# Patient Record
Sex: Female | Born: 1965 | Race: White | Hispanic: No | Marital: Married | Smoking: Never smoker
Health system: Southern US, Community
[De-identification: ages and names within clinical notes are randomized; demographics above are authoritative.]

## PROBLEM LIST (undated history)

## (undated) DIAGNOSIS — E049 Nontoxic goiter, unspecified: Secondary | ICD-10-CM

## (undated) DIAGNOSIS — D259 Leiomyoma of uterus, unspecified: Secondary | ICD-10-CM

## (undated) HISTORY — DX: Leiomyoma of uterus, unspecified: D25.9

## (undated) HISTORY — DX: Nontoxic goiter, unspecified: E04.9

---

## 2012-02-13 HISTORY — PX: TOTAL THYROIDECTOMY: SHX2547

## 2012-02-13 HISTORY — PX: ABDOMINAL HYSTERECTOMY: SHX81

## 2014-10-26 DIAGNOSIS — E049 Nontoxic goiter, unspecified: Secondary | ICD-10-CM | POA: Insufficient documentation

## 2016-07-10 ENCOUNTER — Emergency Department
Admission: EM | Admit: 2016-07-10 | Discharge: 2016-07-10 | Disposition: A | Discharge status: Home / Self Care | Payer: BC Managed Care – PPO | Attending: Emergency Medicine | Admitting: Emergency Medicine

## 2016-07-10 DIAGNOSIS — S199XXA Unspecified injury of neck, initial encounter: Secondary | ICD-10-CM | POA: Diagnosis present

## 2016-07-10 DIAGNOSIS — Y939 Activity, unspecified: Secondary | ICD-10-CM | POA: Diagnosis not present

## 2016-07-10 DIAGNOSIS — S161XXA Strain of muscle, fascia and tendon at neck level, initial encounter: Secondary | ICD-10-CM | POA: Diagnosis not present

## 2016-07-10 DIAGNOSIS — Y999 Unspecified external cause status: Secondary | ICD-10-CM | POA: Diagnosis not present

## 2016-07-10 DIAGNOSIS — Y92410 Unspecified street and highway as the place of occurrence of the external cause: Secondary | ICD-10-CM | POA: Diagnosis not present

## 2016-07-10 MED ORDER — CYCLOBENZAPRINE HCL 5 MG PO TABS: 5.0000 mg | ORAL_TABLET | Freq: Three times a day (TID) | ORAL | 0 refills | Status: DC | PRN

## 2016-07-10 MED ORDER — IBUPROFEN 800 MG PO TABS
800.0000 mg | ORAL_TABLET | Freq: Once | ORAL | Status: AC
  Administered 2016-07-10: 800 mg via ORAL
  Filled 2016-07-10: qty 1

## 2016-07-10 MED ORDER — IBUPROFEN 800 MG PO TABS: 800.0000 mg | ORAL_TABLET | Freq: Three times a day (TID) | ORAL | 0 refills | Status: DC | PRN

## 2016-07-10 MED ORDER — CYCLOBENZAPRINE HCL 10 MG PO TABS
10.0000 mg | ORAL_TABLET | Freq: Once | ORAL | Status: AC
  Administered 2016-07-10: 10 mg via ORAL
  Filled 2016-07-10: qty 1

## 2016-07-10 NOTE — Discharge Instructions (Signed)
Please take medications as prescribed and follow-up with primary care provider or Dr. Roland Rack office at [facility]. Return to the ER for any worsening symptoms or urgent changes in her health.
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
## 2016-07-12 ENCOUNTER — Other Ambulatory Visit: Payer: Self-pay | Admitting: Student

## 2016-07-12 DIAGNOSIS — Z1231 Encounter for screening mammogram for malignant neoplasm of breast: Secondary | ICD-10-CM

## 2016-08-01 ENCOUNTER — Ambulatory Visit
Admission: RE | Admit: 2016-08-01 | Discharge: 2016-08-01 | Disposition: A | Discharge status: Home / Self Care | Payer: BC Managed Care – PPO | Source: Ambulatory Visit | Attending: Student | Admitting: Student

## 2016-08-01 ENCOUNTER — Other Ambulatory Visit: Payer: Self-pay | Admitting: Student

## 2016-08-01 DIAGNOSIS — Z1231 Encounter for screening mammogram for malignant neoplasm of breast: Secondary | ICD-10-CM | POA: Insufficient documentation

## 2016-08-01 DIAGNOSIS — R928 Other abnormal and inconclusive findings on diagnostic imaging of breast: Secondary | ICD-10-CM | POA: Diagnosis not present

## 2016-08-03 ENCOUNTER — Other Ambulatory Visit: Payer: Self-pay | Admitting: *Deleted

## 2016-08-03 ENCOUNTER — Inpatient Hospital Stay
Admission: RE | Admit: 2016-08-03 | Discharge: 2016-08-03 | Disposition: A | Discharge status: Home / Self Care | Payer: Self-pay | Source: Ambulatory Visit | Attending: *Deleted | Admitting: *Deleted

## 2016-08-03 DIAGNOSIS — Z9289 Personal history of other medical treatment: Secondary | ICD-10-CM

## 2016-08-06 ENCOUNTER — Other Ambulatory Visit: Payer: Self-pay | Admitting: Student

## 2016-08-06 DIAGNOSIS — R928 Other abnormal and inconclusive findings on diagnostic imaging of breast: Secondary | ICD-10-CM

## 2016-08-06 DIAGNOSIS — N6320 Unspecified lump in the left breast, unspecified quadrant: Secondary | ICD-10-CM

## 2016-08-08 ENCOUNTER — Ambulatory Visit
Admission: RE | Admit: 2016-08-08 | Discharge: 2016-08-08 | Disposition: A | Discharge status: Home / Self Care | Payer: BC Managed Care – PPO | Source: Ambulatory Visit | Attending: Student | Admitting: Student

## 2016-08-08 DIAGNOSIS — N6002 Solitary cyst of left breast: Secondary | ICD-10-CM | POA: Insufficient documentation

## 2016-08-08 DIAGNOSIS — R928 Other abnormal and inconclusive findings on diagnostic imaging of breast: Secondary | ICD-10-CM

## 2016-08-08 DIAGNOSIS — N6320 Unspecified lump in the left breast, unspecified quadrant: Secondary | ICD-10-CM

## 2016-08-20 ENCOUNTER — Ambulatory Visit: Payer: BC Managed Care – PPO

## 2016-08-20 ENCOUNTER — Other Ambulatory Visit: Payer: BC Managed Care – PPO

## 2016-08-21 ENCOUNTER — Encounter: Payer: BC Managed Care – PPO | Attending: Family Medicine | Admitting: Dietician

## 2016-08-21 ENCOUNTER — Encounter: Payer: Self-pay | Admitting: Dietician

## 2016-08-21 VITALS — Ht 66.0 in | Wt 151.7 lb

## 2016-08-21 DIAGNOSIS — R7303 Prediabetes: Secondary | ICD-10-CM | POA: Diagnosis not present

## 2016-08-21 DIAGNOSIS — E7800 Pure hypercholesterolemia, unspecified: Secondary | ICD-10-CM

## 2016-08-21 NOTE — Patient Instructions (Signed)
   Use whole grain breads, including tortillas for wraps, pita breads, ry crisp breads in place of crackers, or triscuit crackers.   Include high fiber foods on a regular basis to help with blood sugar and cholesterol control.  Try peanut butter with less sugar and sodium such as Simply Jif.   Having a protein source with meals and snacks also helps prevent low blood sugar symptoms.

## 2016-08-21 NOTE — Progress Notes (Signed)
Medical Nutrition Therapy: Visit start time: 1100  end time: 1230  Assessment:  Diagnosis: pre-diabetes Past medical history: hyperlipidemia Psychosocial issues/ stress concerns: none Preferred learning method:  . Auditory . Visual  Current weight: 151.7lbs  Height: 5'6" Medications, supplements: none taken at this time  Progress and evaluation: Patient reports working on positive diet changes, such as cooking more at home, drinking less alcohol --now occasional light beer or small glass wine (was having a drink nightly), increasing vegetables and fruits. Avoids milk and ice cream due GI symptoms. She states she has lost 5-6lbs since making changes several weeks ago. The family has been making lower fat food choices for several years, since husband had heart surgery. She seeks help in making sure she is working on best ways to control blood sugar and cholesterol.   Physical activity: beginning some exercise, yoga and cardio. Hurt knee in December and had stopped exercise for the past several months.  Dietary Intake:  Usual eating pattern includes 3 meals and 2-3 snacks per day. Dining out frequency: 2 meals per week.  Breakfast: oatmeal or 2 eggs and whole grain toast. 1-2c black coffee Snack: 9-10am peanut butter crackers (homemade) Lunch: leftovers, 7/9 small portion steak and veg;. Quiche; salad and chicken; sandwich with peanut butter and jelly or banana or mayo and baked chips or veggie straws Snack: fruit, or veggies and dip, veggie straws Supper: chicken, fish 1x a week; veg.  Snack: sometimes chips; less recently due to less alcohol; sometimes fruit Beverages: water, flavored waters, occasional diet soda, unsweet tea.   Nutrition Care Education: Topics covered: diabetes prevention, hyperlipidemia Basic nutrition: basic food groups, appropriate nutrient balance, appropriate meal and snack schedule, general nutrition guidelines    Weight control: determined energy needs for weight  loss at 1600kcal daily with increased exercise; provided guidance for carbohydrate and protein needs (180g or 12 servings CHO, 70g or 10oz protein choices daily).  Diabetes prevention:  goals for BGs, appropriate meal and snack schedule, appropriate carb intake and balance, role of protein and fiber, preventing low BG symptoms and treating symptoms, healthy carbohydrate choices; balanced meal options. Hyperlipidemia: healthy and unhealthy fats, role of fiber, food sources of phytochemicals, plant sterols.  Other lifestyle changes:  benefits of regular exercise, tracking food intake.  Nutritional Diagnosis:  [place]-2.2 Altered nutrition-related laboratory As related to elevated HbA1C, hyperlipidemia.  As evidenced by lab report.  Intervention: Instruction as noted above.   Set goals with input from patient.    Commended patient for changes already made.    She requests BG check at follow-up visit.   Education Materials given:  . General diet guidelines for Diabetes . Food lists/ Planning A Balanced Meal . Sample meal pattern/ menus: Quick and Healthy Meals, Lunch on the Go . Goals/ instructions  Learner/ who was taught:  . Patient   Level of understanding: Marland Kitchen Verbalizes/ demonstrates competency  Demonstrated degree of understanding via:   Teach back Learning barriers: . None  Willingness to learn/ readiness for change: . Eager, change in progress  Monitoring and Evaluation:  Dietary intake, exercise, BG control, and body weight      follow up: 10/18/16

## 2016-10-18 ENCOUNTER — Ambulatory Visit: Payer: BC Managed Care – PPO | Admitting: Dietician

## 2017-08-30 ENCOUNTER — Ambulatory Visit (INDEPENDENT_AMBULATORY_CARE_PROVIDER_SITE_OTHER): Payer: BC Managed Care – PPO | Admitting: Obstetrics and Gynecology

## 2017-08-30 ENCOUNTER — Encounter: Payer: Self-pay | Admitting: Obstetrics and Gynecology

## 2017-08-30 VITALS — BP 124/76 | HR 77 | Ht 66.0 in | Wt 154.0 lb

## 2017-08-30 DIAGNOSIS — Z0000 Encounter for general adult medical examination without abnormal findings: Secondary | ICD-10-CM

## 2017-08-30 DIAGNOSIS — Z1211 Encounter for screening for malignant neoplasm of colon: Secondary | ICD-10-CM | POA: Diagnosis not present

## 2017-08-30 DIAGNOSIS — Z01411 Encounter for gynecological examination (general) (routine) with abnormal findings: Secondary | ICD-10-CM | POA: Diagnosis not present

## 2017-08-30 DIAGNOSIS — Z9889 Other specified postprocedural states: Secondary | ICD-10-CM | POA: Diagnosis not present

## 2017-08-30 DIAGNOSIS — E890 Postprocedural hypothyroidism: Secondary | ICD-10-CM

## 2017-08-30 DIAGNOSIS — Z1231 Encounter for screening mammogram for malignant neoplasm of breast: Secondary | ICD-10-CM | POA: Diagnosis not present

## 2017-08-30 DIAGNOSIS — Z9009 Acquired absence of other part of head and neck: Secondary | ICD-10-CM

## 2017-08-30 DIAGNOSIS — R7303 Prediabetes: Secondary | ICD-10-CM | POA: Diagnosis not present

## 2017-08-30 DIAGNOSIS — Z1239 Encounter for other screening for malignant neoplasm of breast: Secondary | ICD-10-CM

## 2017-08-30 NOTE — Progress Notes (Signed)
Gynecology Annual Exam  PCP: Derinda Late, MD  Chief Complaint:     Chief Complaint  Patient presents with  . Gynecologic Exam   History of Present Illness:Patient is a 52 y.o. [id number] presents for annual exam. The patient has no complaints today.  LMP: No LMP recorded. Patient has had a hysterectomy.  Menarche:not applicable  The patient is sexually active. She denies dyspareunia. The patient does perform self breast exams. There is no notable family history of breast or ovarian cancer in her family.  The patient wears seatbelts: yes. The patient has regular exercise: yes. She runs on the treadmill and does lunges et.  The patient denies current symptoms of depression.  Review of Systems: ROS Review of Systems - General ROS: negative Psychological ROS: negative ENT ROS: negative Hematological and Lymphatic ROS: negative Breast ROS: negative for breast lumps Respiratory ROS: no cough, shortness of breath, or wheezing Cardiovascular ROS: no chest pain or dyspnea on exertion Gastrointestinal ROS: no abdominal pain, change in bowel habits, or black or bloody stools Musculoskeletal ROS: negative  Past Medical History:      Past Medical History:  Diagnosis Date  . Goiter   . Uterine fibroid    Past Surgical History:       Past Surgical History:  Procedure Laterality Date  . ABDOMINAL HYSTERECTOMY  2014   Partial   . TOTAL THYROIDECTOMY Right 2014   Gynecologic History:  No LMP recorded. Patient has had a hysterectomy.  Last Pap: Results were: Normal per patient before her hysterectomy  Last mammogram: 2018 Results were: BI-RAD I  Obstetric History: [id number]  Family History:       Family History  Problem Relation Age of Onset  . Dementia Mother 63  . Heart failure Mother   . Parkinson's disease Father    Social History:  Social History        Socioeconomic History  . Marital status: Married    Spouse name: Not on file  . Number of children: Not on file  .  Years of education: Not on file  . Highest education level: Not on file  Occupational History  . Not on file  Social Needs  . Financial resource strain: Not on file  . Food insecurity:    Worry: Not on file    Inability: Not on file  . Transportation needs:    Medical: Not on file    Non-medical: Not on file  Tobacco Use  . Smoking status: Never Smoker  . Smokeless tobacco: Never Used  Substance and Sexual Activity  . Alcohol use: Yes    Alcohol/week: 1.8 oz    Types: 3 Standard drinks or equivalent per week  . Drug use: Never  . Sexual activity: Yes    Birth control/protection: Surgical    Comment: Hysterectomy   Lifestyle  . Physical activity:    Days per week: Not on file    Minutes per session: Not on file  . Stress: Not on file  Relationships  . Social connections:    Talks on phone: Not on file    Gets together: Not on file    Attends religious service: Not on file    Active member of club or organization: Not on file    Attends meetings of clubs or organizations: Not on file    Relationship status: Not on file  . Intimate partner violence:    Fear of current or ex partner: Not on file    Emotionally abused: Not  on file    Physically abused: Not on file    Forced sexual activity: Not on file  Other Topics Concern  . Not on file  Social History Narrative  . Not on file   Allergies:      Allergies  Allergen Reactions  . Penicillins Hives   Medications:  Prior to Admission medications   Not on File  Physical Exam  Vitals: Blood pressure 124/76, pulse 77, height 5\' 6"  (1.676 m), weight 154 lb (69.9 kg).  General: NAD  HEENT: normocephalic, anicteric  Thyroid: no enlargement, no palpable nodules  Pulmonary: No increased work of breathing, CTAB  Cardiovascular: RRR, distal pulses 2+  Breast: Breast symmetrical, no tenderness, no palpable nodules or masses, no skin or nipple retraction present, no nipple discharge. No axillary or supraclavicular  lymphadenopathy.  Abdomen: NABS, soft, non-tender, non-distended. Umbilicus without lesions. No hepatomegaly, splenomegaly or masses palpable. No evidence of hernia  Genitourinary:  External: Normal external female genitalia. Normal urethral meatus, normal Bartholin's and Skene's glands.  Vagina: Normal vaginal mucosa, no evidence of prolapse.  Cervix: Grossly normal in appearance, no bleeding  Uterus: Non-enlarged, mobile, normal contour. No CMT  Adnexa: ovaries non-enlarged, no adnexal masses  Rectal: deferred  Lymphatic: no evidence of inguinal lymphadenopathy  Extremities: no edema, erythema, or tenderness  Neurologic: Grossly intact  Psychiatric: mood appropriate, affect full  Female chaperone present for pelvic and breast portions of the physical exam  Assessment: 52 y.o. [id number] routine annual exam  Plan:     Problem List Items Addressed This Visit     None      1) Mammogram - recommend yearly screening mammogram. Mammogram Was ordered today  2) STI screening wasoffered and declined  3) ASCCP guidelines and rational discussed. Patient opts for discontinue secondary to prior hysterectomy screening interval  4) Osteoporosis  - per USPTF routine screening DEXA at age 40  - FRAX 67 year major fracture risk 94, 10 year hip fracture risk 0.7  Consider FDA-approved medical therapies in postmenopausal women and men aged 54 years and older, based on the following:  a) A hip or vertebral (clinical or morphometric) fracture  b) T-score ? -2.5 at the femoral neck or spine after appropriate evaluation to exclude secondary causes  C) Low bone mass (T-score between -1.0 and -2.5 at the femoral neck or spine) and a 10-year probability of a hip fracture ? 3% or a 10-year probability of a major osteoporosis-related fracture ? 20% based on the [place]-adapted WHO algorithm  5) Routine healthcare maintenance including cholesterol, diabetes screening discussed To return fasting at a later date  6)  Colonoscopy declined, she desires a cologuard. Screening recommended starting at age 62 for average risk individuals, age 75 for individuals deemed at increased risk (including African Americans) and recommended to continue until age 19. For patient age 16-85 individualized approach is recommended. Gold standard screening is via colonoscopy, Cologuard screening is an acceptable alternative for patient unwilling or unable to undergo colonoscopy. "Colorectal cancer screening for average?risk adults: 2018 guideline update from the American Cancer Society"[place]: A Cancer Journal for Clinicians: Jul 11, 2016  7) No follow-ups on file.    Adrian Prows MD [facility], [facility] 08/30/17 3:49 PM

## 2017-09-07 ENCOUNTER — Encounter: Payer: Self-pay | Admitting: Obstetrics and Gynecology

## 2017-09-19 ENCOUNTER — Other Ambulatory Visit: Payer: BC Managed Care – PPO

## 2017-09-19 DIAGNOSIS — Z9009 Acquired absence of other part of head and neck: Secondary | ICD-10-CM

## 2017-09-19 DIAGNOSIS — Z0000 Encounter for general adult medical examination without abnormal findings: Secondary | ICD-10-CM

## 2017-09-19 DIAGNOSIS — R7303 Prediabetes: Secondary | ICD-10-CM

## 2017-09-19 DIAGNOSIS — E890 Postprocedural hypothyroidism: Secondary | ICD-10-CM

## 2017-09-19 DIAGNOSIS — Z1239 Encounter for other screening for malignant neoplasm of breast: Secondary | ICD-10-CM

## 2017-09-20 LAB — BASIC METABOLIC PANEL
BUN / CREAT RATIO: 17 (ref 9–23)
BUN: 12 mg/dL (ref 6–24)
CALCIUM: 9.2 mg/dL (ref 8.7–10.2)
CHLORIDE: 103 mmol/L (ref 96–106)
CO2: 22 mmol/L (ref 20–29)
Creatinine, Ser: 0.71 mg/dL (ref 0.57–1.00)
GFR calc non Af Amer: 97 mL/min/{1.73_m2} (ref >59)
GFR, EST AFRICAN AMERICAN: 112 mL/min/{1.73_m2} (ref >59)
Glucose: 102 mg/dL — ABNORMAL HIGH (ref 65–99)
Potassium: 4.5 mmol/L (ref 3.5–5.2)
Sodium: 141 mmol/L (ref 134–144)

## 2017-09-20 LAB — CBC
HEMOGLOBIN: 13.9 g/dL (ref 11.1–15.9)
Hematocrit: 43.2 % (ref 34.0–46.6)
MCH: 30.2 pg (ref 26.6–33.0)
MCHC: 32.2 g/dL (ref 31.5–35.7)
MCV: 94 fL (ref 79–97)
Platelets: 312 10*3/uL (ref 150–450)
RBC: 4.6 x10E6/uL (ref 3.77–5.28)
RDW: 13.5 % (ref 12.3–15.4)
WBC: 6 10*3/uL (ref 3.4–10.8)

## 2017-09-20 LAB — HEMOGLOBIN A1C
ESTIMATED AVERAGE GLUCOSE: 117 mg/dL
HEMOGLOBIN A1C: 5.7 % — AB (ref 4.8–5.6)

## 2017-09-20 LAB — TSH+FREE T4
Free T4: 1.23 ng/dL (ref 0.82–1.77)
TSH: 2.35 u[IU]/mL (ref 0.450–4.500)

## 2017-10-09 ENCOUNTER — Ambulatory Visit
Admission: RE | Admit: 2017-10-09 | Discharge: 2017-10-09 | Disposition: A | Discharge status: Home / Self Care | Payer: BC Managed Care – PPO | Source: Ambulatory Visit | Attending: Obstetrics and Gynecology | Admitting: Obstetrics and Gynecology

## 2017-10-09 DIAGNOSIS — Z1231 Encounter for screening mammogram for malignant neoplasm of breast: Secondary | ICD-10-CM | POA: Diagnosis present

## 2018-05-02 IMAGING — MG MM DIGITAL DIAGNOSTIC UNILAT*L* W/ TOMO W/ CAD
7 series · 8 of 15 positions shown · non-contrast
Comparison: Previous exam(s).

CLINICAL DATA: Screening recall for a possible mass in the left
breast.

EXAM:
2D DIGITAL DIAGNOSTIC LEFT MAMMOGRAM WITH CAD AND ADJUNCT TOMO
ULTRASOUND LEFT BREAST

[L MLO synth-2D]
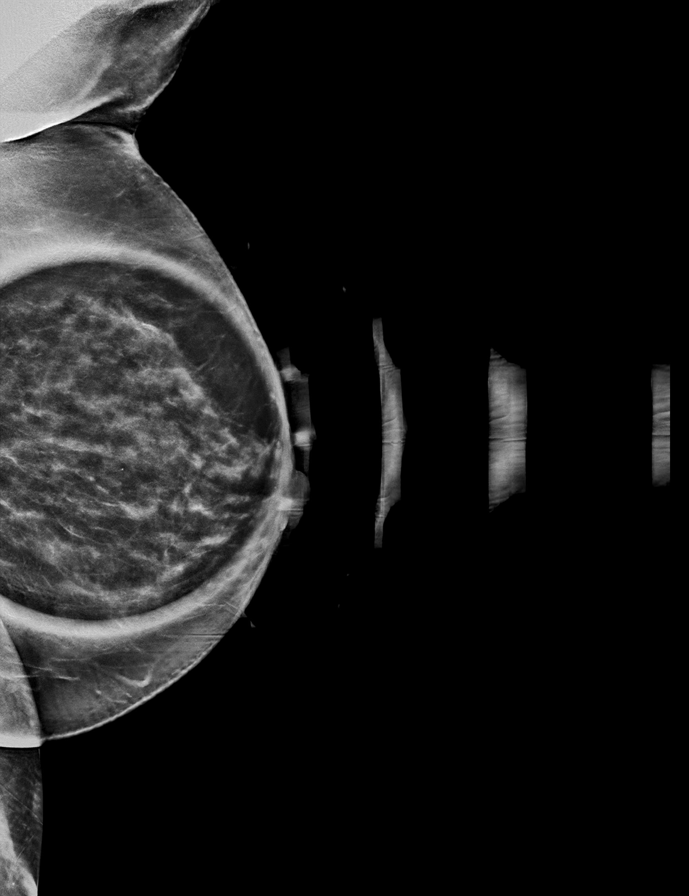

[L ML]
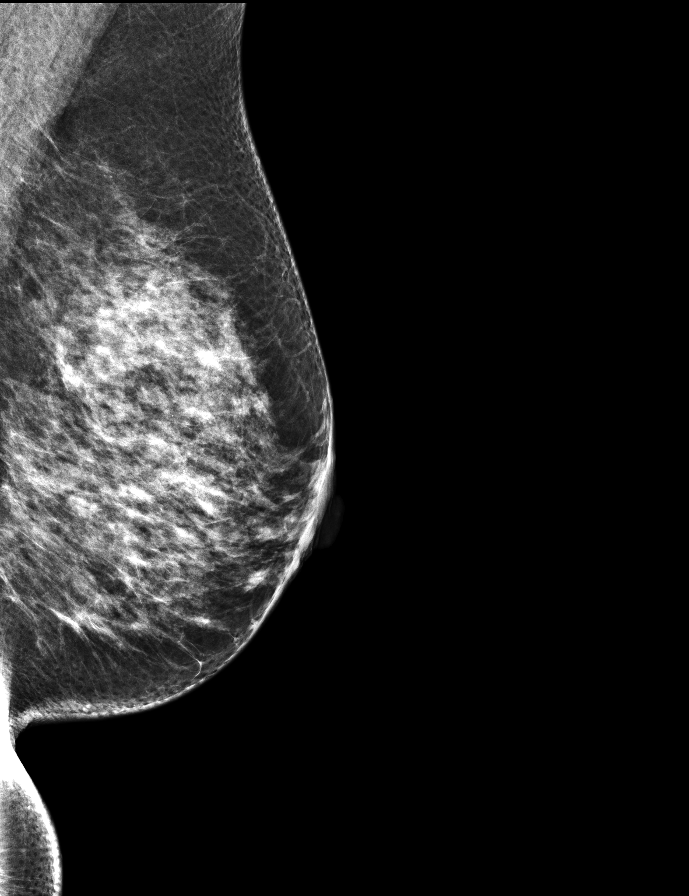

[L ML synth-2D]
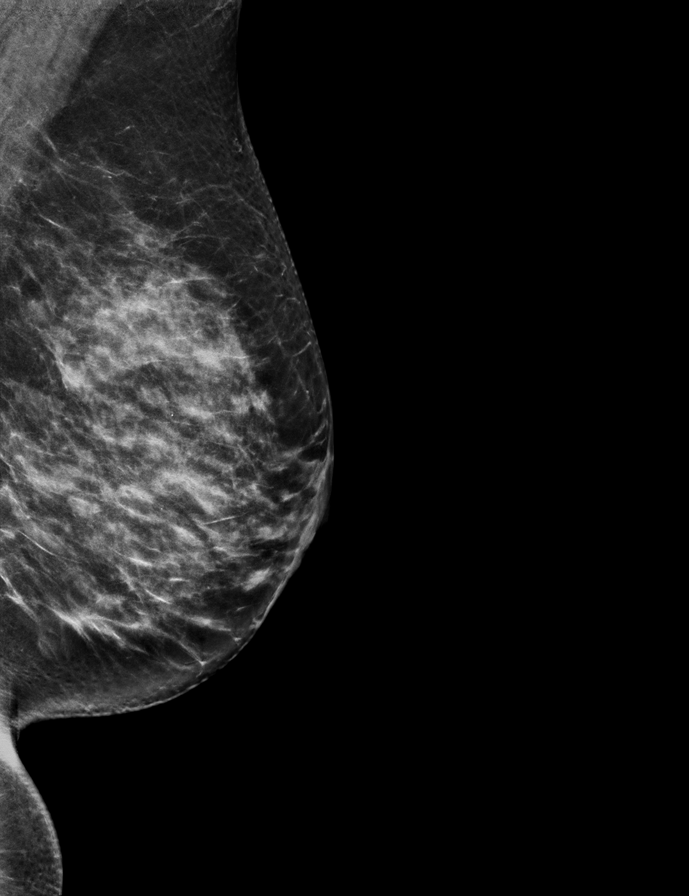

[L MLO (1 of 2)]
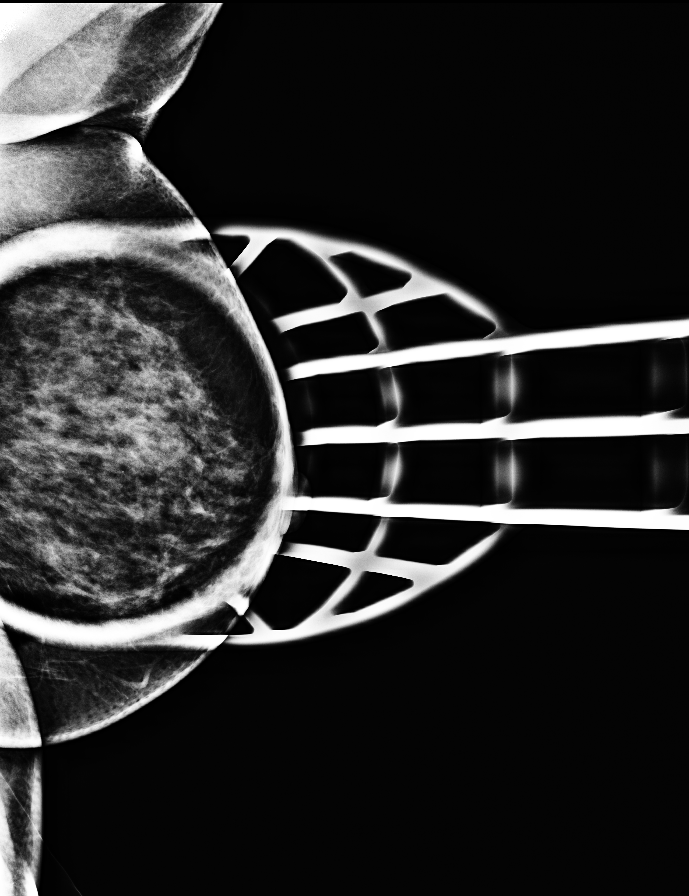

[L ML tomo · 2 of 67 frames shown]
[frame 22/67]
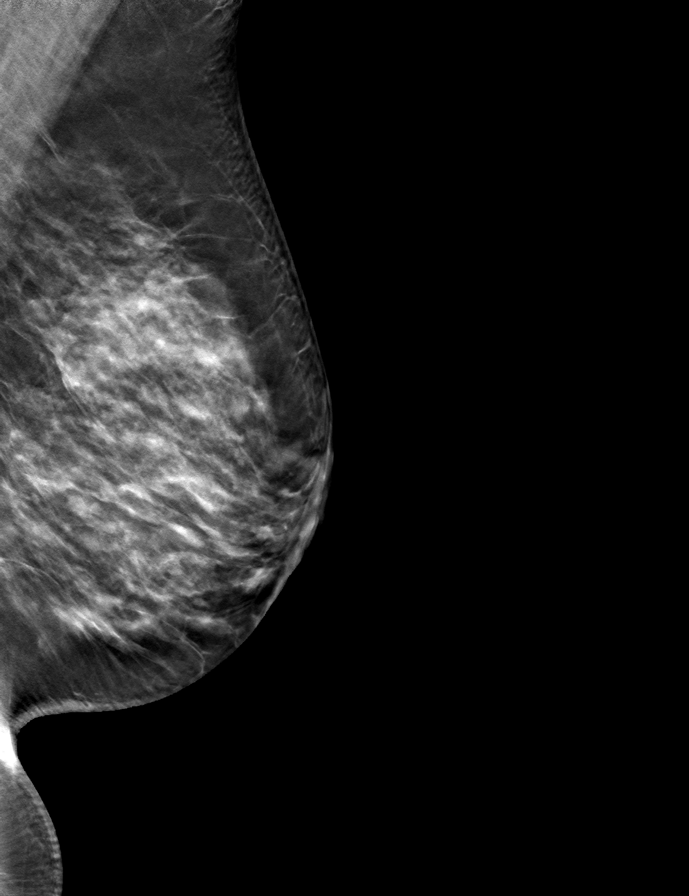
[frame 34/67]
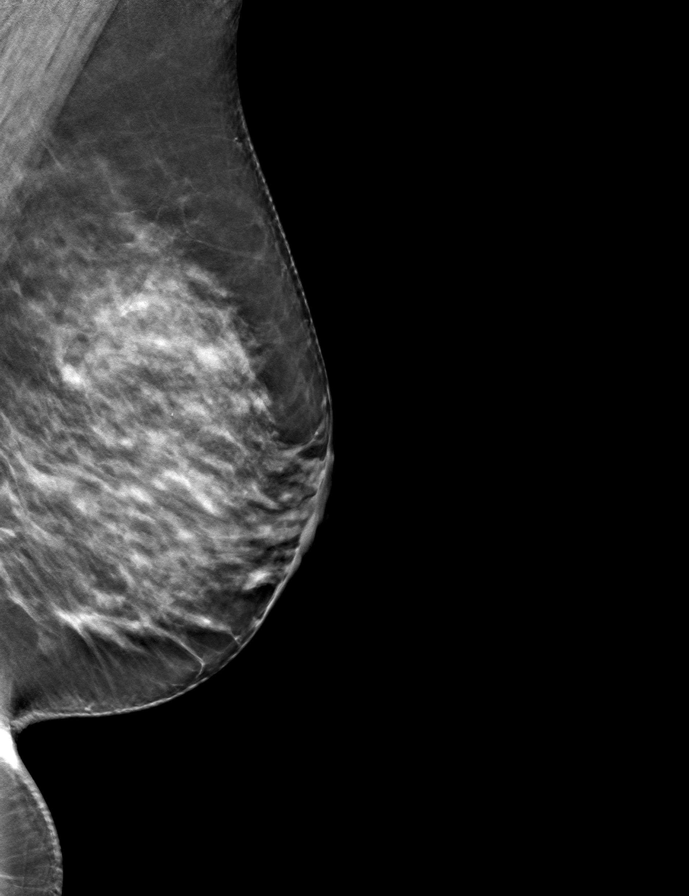

[L MLO tomo · tomo slice 35/69.0]
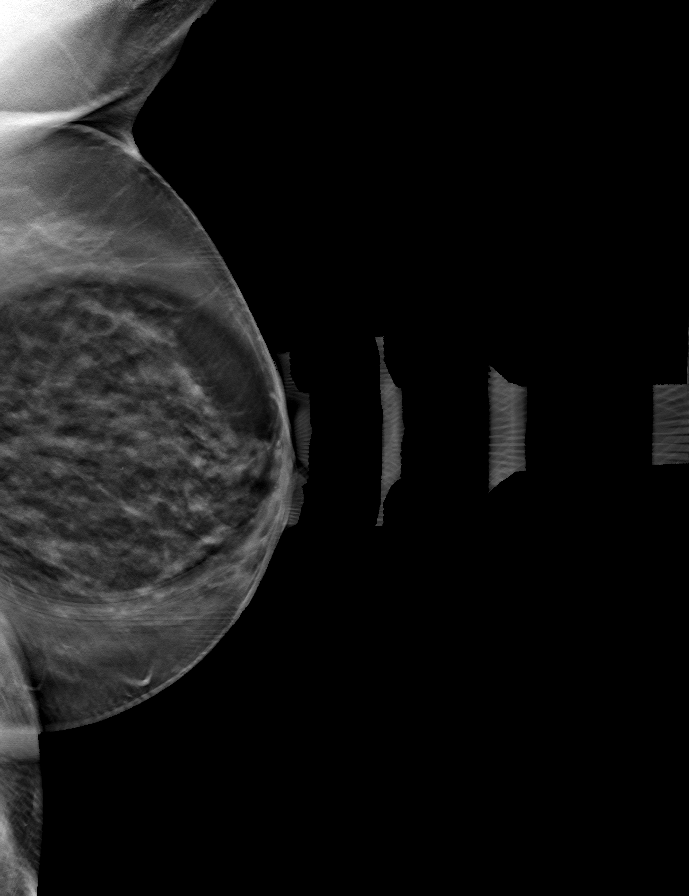

[L MLO (2 of 2)]
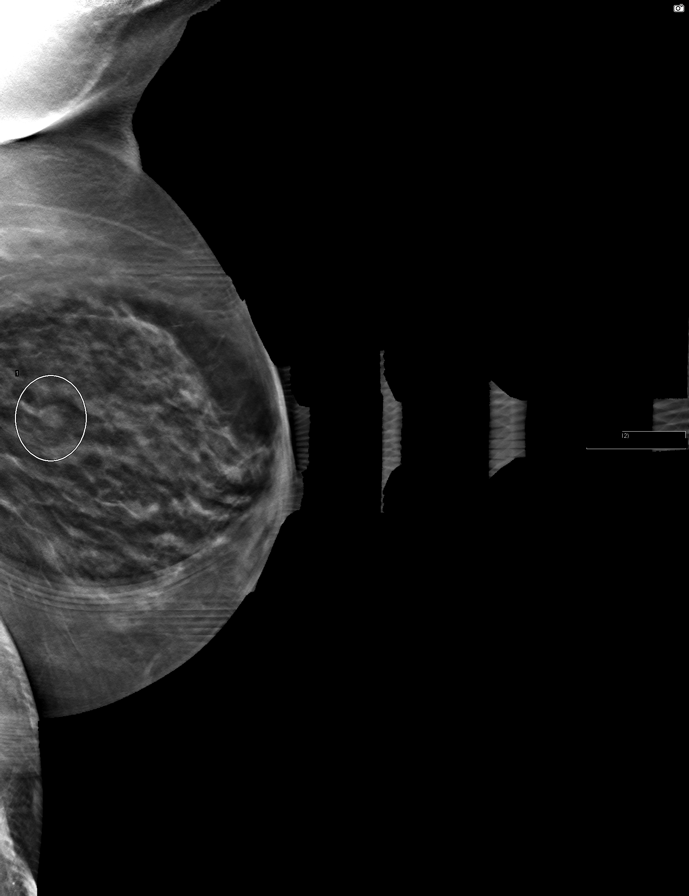

[8 of 15 positions shown; findings below may reference images not displayed]

ACR Breast Density Category c: The breast tissue is heterogeneously
dense, which may obscure small masses.
FINDINGS: On the diagnostic images, the possible mass is evident as a partly
circumscribed small oval mass in the lateral aspect of the breast
slightly below midline. There are no other discrete masses, no areas
of architectural distortion and no suspicious calcifications.

Mammographic images were processed with CAD.

On physical exam, no mass is palpated in the lateral left breast.

Targeted ultrasound is performed, showing a small cyst in the left
breast at the 3:30 o'clock position, 3 cm the nipple, measuring 6 x
5 x 5 mm, consistent in size, shape and location to the mammographic
finding. There are several other smaller cysts in the upper outer
quadrant. There are no solid masses or suspicious lesions.
IMPRESSION: 1. No evidence of malignancy.
2. Benign left breast cysts.

RECOMMENDATION:
Screening mammogram in one year.(Code:[id number]-L-3M1)

I have discussed the findings and recommendations with the patient.
Results were also provided in writing at the conclusion of the
visit. If applicable, a reminder letter will be sent to the patient
regarding the next appointment.

BI-RADS CATEGORY  2: Benign.

## 2019-02-11 ENCOUNTER — Encounter: Payer: Self-pay | Admitting: Obstetrics and Gynecology

## 2019-02-11 ENCOUNTER — Other Ambulatory Visit: Payer: Self-pay

## 2019-02-11 ENCOUNTER — Ambulatory Visit (INDEPENDENT_AMBULATORY_CARE_PROVIDER_SITE_OTHER): Payer: BC Managed Care – PPO | Admitting: Obstetrics and Gynecology

## 2019-02-11 ENCOUNTER — Other Ambulatory Visit (HOSPITAL_COMMUNITY)
Admission: RE | Admit: 2019-02-11 | Discharge: 2019-02-11 | Disposition: A | Discharge status: Home / Self Care | Payer: BC Managed Care – PPO | Source: Ambulatory Visit | Attending: Obstetrics and Gynecology | Admitting: Obstetrics and Gynecology

## 2019-02-11 VITALS — BP 136/82 | Ht 66.0 in | Wt 160.0 lb

## 2019-02-11 DIAGNOSIS — Z9009 Acquired absence of other part of head and neck: Secondary | ICD-10-CM

## 2019-02-11 DIAGNOSIS — Z1231 Encounter for screening mammogram for malignant neoplasm of breast: Secondary | ICD-10-CM

## 2019-02-11 DIAGNOSIS — E890 Postprocedural hypothyroidism: Secondary | ICD-10-CM

## 2019-02-11 DIAGNOSIS — L918 Other hypertrophic disorders of the skin: Secondary | ICD-10-CM

## 2019-02-11 DIAGNOSIS — Z13220 Encounter for screening for lipoid disorders: Secondary | ICD-10-CM

## 2019-02-11 DIAGNOSIS — Z1212 Encounter for screening for malignant neoplasm of rectum: Secondary | ICD-10-CM

## 2019-02-11 DIAGNOSIS — Z1211 Encounter for screening for malignant neoplasm of colon: Secondary | ICD-10-CM

## 2019-02-11 DIAGNOSIS — R7303 Prediabetes: Secondary | ICD-10-CM

## 2019-02-11 DIAGNOSIS — Z0000 Encounter for general adult medical examination without abnormal findings: Secondary | ICD-10-CM

## 2019-02-11 DIAGNOSIS — Z818 Family history of other mental and behavioral disorders: Secondary | ICD-10-CM

## 2019-02-11 NOTE — Patient Instructions (Addendum)
[employer] [place] for Calcium and Vitamin D  Age (yr) Calcium Recommended Dietary Allowance (mg/day) Vitamin D Recommended Dietary Allowance (international units/day)  9-18 1,300 600  19-50 1,000 600  51-70 1,200 600  71 and older 1,200 800  Data from [employer]. Dietary reference intakes: calcium, vitamin D. [place], [place]: [employer]; 2011.      Dementia Caregiver Guide Dementia is a term used to describe a number of symptoms that affect memory and thinking. The most common symptoms include:  Memory loss.  Trouble with language and communication.  Trouble concentrating.  Poor judgment.  Problems with reasoning.  Child-like behavior and language.  Extreme anxiety.  Angry outbursts.  Wandering from home or public places. Dementia usually gets worse slowly over time. In the early stages, people with dementia can stay independent and safe with some help. In later stages, they need help with daily tasks such as dressing, grooming, and using the bathroom. How to help the person with dementia cope Dementia can be frightening and confusing. Here are some tips to help the person with dementia cope with changes caused by the disease. General tips  Keep the person on track with his or her routine.  Try to identify areas where the person may need help.  Be supportive, patient, calm, and encouraging.  Gently remind the person that adjusting to changes takes time.  Help with the tasks that the person has asked for help with.  Keep the person involved in daily tasks and decisions as much as possible.  Encourage conversation, but try not to get frustrated or harried if the person struggles to find words or does not seem to appreciate your help. Communication tips  When the person is talking or seems frustrated, make eye contact and hold the person's hand.  Ask specific questions that need yes or no  answers.  Use simple words, short sentences, and a calm voice. Only give one direction at a time.  When offering choices, limit them to just 1 or 2.  Avoid correcting the person in a negative way.  If the person is struggling to find the right words, gently try to help him or her. How to recognize symptoms of stress Symptoms of stress in caregivers include:  Feeling frustrated or angry with the person with dementia.  Denying that the person has dementia or that his or her symptoms will not improve.  Feeling hopeless and unappreciated.  Difficulty sleeping.  Difficulty concentrating.  Feeling anxious, irritable, or depressed.  Developing stress-related health problems.  Feeling like you have too little time for your own life. Follow these instructions at home:   Make sure that you and the person you are caring for: ? Get regular sleep. ? Exercise regularly. ? Eat regular, nutritious meals. ? Drink enough fluid to keep your urine clear or pale yellow. ? Take over-the-counter and prescription medicines only as told by your health care providers. ? Attend all scheduled health care appointments.  Join a support group with others who are caregivers.  Ask about respite care resources so that you can have a regular break from the stress of caregiving.  Look for signs of stress in yourself and in the person you are caring for. If you notice signs of stress, take steps to manage it.  Consider any safety risks and take steps to avoid them.  Organize medications in a pill box for each day of the week.  Create a plan to handle any  legal or financial matters. Get legal or financial advice if needed.  Keep a calendar in a central location to remind the person of appointments or other activities. Tips for reducing the risk of injury  Keep floors clear of clutter. Remove rugs, magazine racks, and floor lamps.  Keep hallways well lit, especially at night.  Put a handrail and  nonslip mat in the bathtub or shower.  Put childproof locks on cabinets that contain dangerous items, such as medicines, alcohol, guns, toxic cleaning items, sharp tools or utensils, matches, and lighters.  Put the locks in places where the person cannot see or reach them easily. This will help ensure that the person does not wander out of the house and get lost.  Be prepared for emergencies. Keep a list of emergency phone numbers and addresses in a convenient area.  Remove car keys and lock garage doors so that the person does not try to get in the car and drive.  Have the person wear a bracelet that tracks locations and identifies the person as having memory problems. This should be worn at all times for safety. Where to find support: Many individuals and organizations offer support. These include:  Support groups for people with dementia and for caregivers.  Counselors or therapists.  Home health care services.  Adult day care centers. Where to find more information Alzheimer[employer] Association: [employer] Contact a health care provider if:  The person's health is rapidly getting worse.  You are no longer able to care for the person.  Caring for the person is affecting your physical and emotional health.  The person threatens himself or herself, you, or anyone else. Summary  Dementia is a term used to describe a number of symptoms that affect memory and thinking.  Dementia usually gets worse slowly over time.  Take steps to reduce the person's risk of injury, and to plan for future care.  Caregivers need support, relief from caregiving, and time for their own lives. This information is not intended to replace advice given to you by your health care provider. Make sure you discuss any questions you have with your health care provider. Document Released: 01/03/2016 Document Revised: 01/11/2017 Document Reviewed: 01/03/2016 Elsevier Patient Education  2020 [employer].

## 2019-02-11 NOTE — Progress Notes (Signed)
Gynecology Annual Exam  PCP: Derinda Late, MD  Chief Complaint:  Chief Complaint  Patient presents with  . Gynecologic Exam    Mom has dementia, is concerned about it because it runs in family     History of Present Illness:Patient is a 53 y.o. [id number] presents for annual exam. The patient has no complaints today.  Review of Systems: ROS  Past Medical History:  Past Medical History:  Diagnosis Date  . Goiter   . Uterine fibroid     Past Surgical History:  Past Surgical History:  Procedure Laterality Date  . ABDOMINAL HYSTERECTOMY  2014   Partial   . TOTAL THYROIDECTOMY Right 2014    Gynecologic History:  No LMP recorded. Patient has had a hysterectomy. Obstetric History: [id number]  Family History:  Family History  Problem Relation Age of Onset  . Dementia Mother 34  . Heart failure Mother   . Parkinson's disease Father   . Breast cancer Neg Hx     Social History:  Social History   Socioeconomic History  . Marital status: Married    Spouse name: Not on file  . Number of children: Not on file  . Years of education: Not on file  . Highest education level: Not on file  Occupational History  . Not on file  Tobacco Use  . Smoking status: Never Smoker  . Smokeless tobacco: Never Used  Substance and Sexual Activity  . Alcohol use: Yes    Alcohol/week: 3.0 standard drinks    Types: 3 Standard drinks or equivalent per week  . Drug use: Never  . Sexual activity: Yes    Birth control/protection: Surgical    Comment: Hysterectomy   Other Topics Concern  . Not on file  Social History Narrative  . Not on file   Social Determinants of Health   Financial Resource Strain:   . Difficulty of Paying Living Expenses: Not on file  Food Insecurity:   . Worried About Charity fundraiser in the Last Year: Not on file  . Ran Out of Food in the Last Year: Not on file  Transportation Needs:   . Lack of Transportation (Medical): Not on file  . Lack of  Transportation (Non-Medical): Not on file  Physical Activity:   . Days of Exercise per Week: Not on file  . Minutes of Exercise per Session: Not on file  Stress:   . Feeling of Stress : Not on file  Social Connections:   . Frequency of Communication with Friends and Family: Not on file  . Frequency of Social Gatherings with Friends and Family: Not on file  . Attends Religious Services: Not on file  . Active Member of Clubs or Organizations: Not on file  . Attends Archivist Meetings: Not on file  . Marital Status: Not on file  Intimate Partner Violence:   . Fear of Current or Ex-Partner: Not on file  . Emotionally Abused: Not on file  . Physically Abused: Not on file  . Sexually Abused: Not on file    Allergies:  Allergies  Allergen Reactions  . Penicillins Hives    Medications: Prior to Admission medications   Not on File    Physical Exam Vitals: Blood pressure 136/82, height 5\' 6"  (1.676 m), weight 160 lb (72.6 kg).  General: NAD HEENT: normocephalic, anicteric Thyroid: no enlargement, no palpable nodules Pulmonary: No increased work of breathing, CTAB Cardiovascular: RRR, distal pulses 2+ Breast: Breast symmetrical, no tenderness, no  palpable nodules or masses, no skin or nipple retraction present, no nipple discharge.  No axillary or supraclavicular lymphadenopathy. Abdomen: NABS, soft, non-tender, non-distended.  Umbilicus without lesions.  No hepatomegaly, splenomegaly or masses palpable. No evidence of hernia  Genitourinary:  External: Normal external female genitalia.  Normal urethral meatus, normal Bartholin's and Skene's glands.    Vagina: Normal vaginal mucosa, no evidence of prolapse.    Cervix: Grossly normal in appearance, no bleeding  Uterus: Non-enlarged, mobile, normal contour.  No CMT  Adnexa: ovaries non-enlarged, no adnexal masses  Rectal: deferred  Lymphatic: no evidence of inguinal lymphadenopathy Extremities: no edema, erythema, or  tenderness Neurologic: Grossly intact Psychiatric: mood appropriate, affect full  Female chaperone present for pelvic and breast  portions of the physical exam     Assessment: 53 y.o. [id number] routine annual exam  Plan: Problem List Items Addressed This Visit    None      1) Mammogram - recommend yearly screening mammogram.  Mammogram Was ordered today  2) STI screening was offered and declined  3) ASCCP guidelines and rational discussed.  Patient opts for every 3 years screening interval  4) Routine healthcare maintenance including cholesterol, diabetes screening discussed Ordered today  6) Cologuard ordered  7) Small skin tag removed  8) Family concerns regarding dementia- referral to neurology placed.  9) Follow up for annual in 1 year  Adrian Prows MD [place], [place] 02/11/2019 3:38 PM

## 2019-02-16 LAB — SURGICAL PATHOLOGY

## 2019-02-18 ENCOUNTER — Encounter: Payer: Self-pay | Admitting: Neurology

## 2019-03-06 ENCOUNTER — Other Ambulatory Visit: Payer: Self-pay

## 2019-03-06 ENCOUNTER — Encounter: Payer: Self-pay | Admitting: Obstetrics and Gynecology

## 2019-03-06 ENCOUNTER — Ambulatory Visit (INDEPENDENT_AMBULATORY_CARE_PROVIDER_SITE_OTHER): Payer: BC Managed Care – PPO | Admitting: Obstetrics and Gynecology

## 2019-03-06 VITALS — BP 120/80 | Ht 66.0 in | Wt 157.0 lb

## 2019-03-06 DIAGNOSIS — Z13220 Encounter for screening for lipoid disorders: Secondary | ICD-10-CM | POA: Diagnosis not present

## 2019-03-06 DIAGNOSIS — Z818 Family history of other mental and behavioral disorders: Secondary | ICD-10-CM | POA: Diagnosis not present

## 2019-03-06 DIAGNOSIS — Z1211 Encounter for screening for malignant neoplasm of colon: Secondary | ICD-10-CM | POA: Diagnosis not present

## 2019-03-06 NOTE — Progress Notes (Signed)
Patient ID: Katelyn Guzman, female   DOB: 04/17/1965, 54 y.o.   MRN: [id number]  Reason for Consult: Follow-up (Fasting lab work)   Referred by Derinda Late, MD  Subjective:     HPI:  Katelyn Guzman is a 54 y.o. female. She is feeling well today. She had an appointment for fasting labs.  Her mother has had the be placed in a care facility after she broke her hip and her Dementia has been challenging.  Has not yet received cologuard in mail.  Healing well from skin tag removal.    Past Medical History:  Diagnosis Date  . Goiter   . Uterine fibroid    Family History  Problem Relation Age of Onset  . Dementia Mother 55  . Heart failure Mother   . Parkinson's disease Father   . Breast cancer Neg Hx    Past Surgical History:  Procedure Laterality Date  . ABDOMINAL HYSTERECTOMY  2014   Partial   . TOTAL THYROIDECTOMY Right 2014    Short Social History:  Social History   Tobacco Use  . Smoking status: Never Smoker  . Smokeless tobacco: Never Used  Substance Use Topics  . Alcohol use: Yes    Alcohol/week: 3.0 standard drinks    Types: 3 Standard drinks or equivalent per week    Allergies  Allergen Reactions  . Penicillins Hives    No current outpatient medications on file.   No current facility-administered medications for this visit.    Review of Systems  Constitutional: Negative for chills, fatigue, fever and unexpected weight change.  HENT: Negative for trouble swallowing.  Eyes: Negative for loss of vision.  Respiratory: Negative for cough, shortness of breath and wheezing.  Cardiovascular: Negative for chest pain, leg swelling, palpitations and syncope.  GI: Negative for abdominal pain, blood in stool, diarrhea, nausea and vomiting.  GU: Negative for difficulty urinating, dysuria, frequency and hematuria.  Musculoskeletal: Negative for back pain, leg pain and joint pain.  Skin: Negative for rash.  Neurological: Negative for dizziness,  headaches, light-headedness, numbness and seizures.  Psychiatric: Negative for behavioral problem, confusion, depressed mood and sleep disturbance.        Objective:  Objective   Vitals:   03/06/19 0815  BP: 120/80  Weight: 157 lb (71.2 kg)  Height: 5\' 6"  (1.676 m)   Body mass index is 25.34 kg/m.  Physical Exam Vitals and nursing note reviewed.  Constitutional:      Appearance: She is well-developed.  HENT:     Head: Normocephalic and atraumatic.  Cardiovascular:     Rate and Rhythm: Normal rate and regular rhythm.  Pulmonary:     Effort: Pulmonary effort is normal.     Breath sounds: Normal breath sounds.  Abdominal:     General: Bowel sounds are normal.     Palpations: Abdomen is soft.  Musculoskeletal:        General: Normal range of motion.  Skin:    General: Skin is warm and dry.  Neurological:     Mental Status: She is alert and oriented to person, place, and time.  Psychiatric:        Behavior: Behavior normal.        Thought Content: Thought content normal.        Judgment: Judgment normal.        Assessment/Plan:     54 yo  1. Has follow up planned to speak with neurology regarding concerns with family history of dementia.  2. Cologuard re-faxed: encouraged patient to message or call if she does not receive it soon.  3. Encouraged patient to call and schedule mammogram 4. Fasting labs today  More than 10 minutes were spent face to face with the patient in the room with more than 50% of the time spent providing counseling and discussing the plan of management.    Adrian Prows MD [facility], [facility] 03/06/2019 8:48 AM

## 2019-03-07 LAB — CBC
Hematocrit: 41.8 % (ref 34.0–46.6)
Hemoglobin: 14.4 g/dL (ref 11.1–15.9)
MCH: 30.1 pg (ref 26.6–33.0)
MCHC: 34.4 g/dL (ref 31.5–35.7)
MCV: 87 fL (ref 79–97)
Platelets: 342 10*3/uL (ref 150–450)
RBC: 4.78 x10E6/uL (ref 3.77–5.28)
RDW: 12 % (ref 11.7–15.4)
WBC: 5 10*3/uL (ref 3.4–10.8)

## 2019-03-07 LAB — LIPID PANEL
Chol/HDL Ratio: 4.5 ratio — ABNORMAL HIGH (ref 0.0–4.4)
Cholesterol, Total: 247 mg/dL — ABNORMAL HIGH (ref 100–199)
HDL: 55 mg/dL (ref >39)
LDL Chol Calc (NIH): 151 mg/dL — ABNORMAL HIGH (ref 0–99)
Triglycerides: 223 mg/dL — ABNORMAL HIGH (ref 0–149)
VLDL Cholesterol Cal: 41 mg/dL — ABNORMAL HIGH (ref 5–40)

## 2019-03-07 LAB — TSH+FREE T4
Free T4: 1.15 ng/dL (ref 0.82–1.77)
TSH: 1.73 u[IU]/mL (ref 0.450–4.500)

## 2019-04-29 ENCOUNTER — Ambulatory Visit: Payer: BC Managed Care – PPO | Admitting: Neurology

## 2019-05-21 LAB — COLOGUARD

## 2019-06-16 NOTE — Telephone Encounter (Signed)
This patient is looking for her cologuard result. Can you help find it?

## 2019-08-04 ENCOUNTER — Ambulatory Visit: Payer: BC Managed Care – PPO | Admitting: Neurology

## 2020-02-15 ENCOUNTER — Ambulatory Visit: Payer: BC Managed Care – PPO | Admitting: Obstetrics and Gynecology

## 2020-03-14 ENCOUNTER — Ambulatory Visit: Payer: BC Managed Care – PPO | Admitting: Obstetrics and Gynecology

## 2020-03-22 ENCOUNTER — Encounter: Payer: Self-pay | Admitting: Obstetrics and Gynecology

## 2020-03-22 ENCOUNTER — Ambulatory Visit (INDEPENDENT_AMBULATORY_CARE_PROVIDER_SITE_OTHER): Payer: BC Managed Care – PPO | Admitting: Obstetrics and Gynecology

## 2020-03-22 ENCOUNTER — Other Ambulatory Visit: Payer: Self-pay

## 2020-03-22 VITALS — BP 120/72 | Ht 66.0 in | Wt 153.0 lb

## 2020-03-22 DIAGNOSIS — Z01419 Encounter for gynecological examination (general) (routine) without abnormal findings: Secondary | ICD-10-CM | POA: Diagnosis not present

## 2020-03-22 DIAGNOSIS — Z1231 Encounter for screening mammogram for malignant neoplasm of breast: Secondary | ICD-10-CM

## 2020-03-22 DIAGNOSIS — Z124 Encounter for screening for malignant neoplasm of cervix: Secondary | ICD-10-CM | POA: Diagnosis not present

## 2020-03-22 DIAGNOSIS — Z130 Encounter for screening for diseases of the blood and blood-forming organs and certain disorders involving the immune mechanism: Secondary | ICD-10-CM

## 2020-03-22 DIAGNOSIS — Z131 Encounter for screening for diabetes mellitus: Secondary | ICD-10-CM

## 2020-03-22 DIAGNOSIS — N951 Menopausal and female climacteric states: Secondary | ICD-10-CM

## 2020-03-22 DIAGNOSIS — Z0000 Encounter for general adult medical examination without abnormal findings: Secondary | ICD-10-CM | POA: Diagnosis not present

## 2020-03-22 DIAGNOSIS — Z13220 Encounter for screening for lipoid disorders: Secondary | ICD-10-CM

## 2020-03-22 DIAGNOSIS — Z1239 Encounter for other screening for malignant neoplasm of breast: Secondary | ICD-10-CM

## 2020-03-22 DIAGNOSIS — Z1329 Encounter for screening for other suspected endocrine disorder: Secondary | ICD-10-CM

## 2020-03-22 MED ORDER — PAROXETINE HCL 10 MG PO TABS: 10.0000 mg | ORAL_TABLET | Freq: Every day | ORAL | 4 refills | Status: DC

## 2020-03-22 NOTE — Patient Instructions (Signed)
Institute of Medicine Recommended Dietary Allowances for Calcium and Vitamin D  Age (yr) Calcium Recommended Dietary Allowance (mg/day) Vitamin D Recommended Dietary Allowance (international units/day)  9-18 1,300 600  19-50 1,000 600  51-70 1,200 600  71 and older 1,200 800  Data from Institute of Medicine. Dietary reference intakes: calcium, vitamin D. [place], [place]: National Academies Press; 2011.    Exercising to Stay Healthy To become healthy and stay healthy, it is recommended that you do moderate-intensity and vigorous-intensity exercise. You can tell that you are exercising at a moderate intensity if your heart starts beating faster and you start breathing faster but can still hold a conversation. You can tell that you are exercising at a vigorous intensity if you are breathing much harder and faster and cannot hold a conversation while exercising. Exercising regularly is important. It has many health benefits, such as:  Improving overall fitness, flexibility, and endurance.  Increasing bone density.  Helping with weight control.  Decreasing body fat.  Increasing muscle strength.  Reducing stress and tension.  Improving overall health. How often should I exercise? Choose an activity that you enjoy, and set realistic goals. Your health care provider can help you make an activity plan that works for you. Exercise regularly as told by your health care provider. This may include:  Doing strength training two times a week, such as: ? Lifting weights. ? Using resistance bands. ? Push-ups. ? Sit-ups. ? Yoga.  Doing a certain intensity of exercise for a given amount of time. Choose from these options: ? A total of 150 minutes of moderate-intensity exercise every week. ? A total of 75 minutes of vigorous-intensity exercise every week. ? A mix of moderate-intensity and vigorous-intensity exercise every week. Children, pregnant women, people who have not exercised  regularly, people who are overweight, and older adults may need to talk with a health care provider about what activities are safe to do. If you have a medical condition, be sure to talk with your health care provider before you start a new exercise program. What are some exercise ideas? Moderate-intensity exercise ideas include:  Walking 1 mile (1.6 km) in about 15 minutes.  Biking.  Hiking.  Golfing.  Dancing.  Water aerobics. Vigorous-intensity exercise ideas include:  Walking 4.5 miles (7.2 km) or more in about 1 hour.  Jogging or running 5 miles (8 km) in about 1 hour.  Biking 10 miles (16.1 km) or more in about 1 hour.  Lap swimming.  Roller-skating or in-line skating.  Cross-country skiing.  Vigorous competitive sports, such as football, basketball, and soccer.  Jumping rope.  Aerobic dancing.   What are some everyday activities that can help me to get exercise?  Yard work, such as: ? Pushing a lawn mower. ? Raking and bagging leaves.  Washing your car.  Pushing a stroller.  Shoveling snow.  Gardening.  Washing windows or floors. How can I be more active in my day-to-day activities?  Use stairs instead of an elevator.  Take a walk during your lunch break.  If you drive, park your car farther away from your work or school.  If you take public transportation, get off one stop early and walk the rest of the way.  Stand up or walk around during all of your indoor phone calls.  Get up, stretch, and walk around every 30 minutes throughout the day.  Enjoy exercise with a friend. Support to continue exercising will help you keep a regular routine of activity. What guidelines   can I follow while exercising?  Before you start a new exercise program, talk with your health care provider.  Do not exercise so much that you hurt yourself, feel dizzy, or get very short of breath.  Wear comfortable clothes and wear shoes with good support.  Drink plenty of  water while you exercise to prevent dehydration or heat stroke.  Work out until your breathing and your heartbeat get faster. Where to find more information  [place] Department of Health and Human Services: www.hhs.gov  Centers for Disease Control and Prevention (CDC): www.cdc.gov Summary  Exercising regularly is important. It will improve your overall fitness, flexibility, and endurance.  Regular exercise also will improve your overall health. It can help you control your weight, reduce stress, and improve your bone density.  Do not exercise so much that you hurt yourself, feel dizzy, or get very short of breath.  Before you start a new exercise program, talk with your health care provider. This information is not intended to replace advice given to you by your health care provider. Make sure you discuss any questions you have with your health care provider. Document Revised: 01/11/2017 Document Reviewed: 12/20/2016 Elsevier Patient Education  2021 Elsevier Inc.   Budget-Friendly Healthy Eating There are many ways to save money at the grocery store and continue to eat healthy. You can be successful if you:  Plan meals according to your budget.  Make a grocery list and only purchase food according to your grocery list.  Prepare food yourself at home. What are tips for following this plan? Reading food labels  Compare food labels between brand name foods and the store brand. Often the nutritional value is the same, but the store brand is lower cost.  Look for products that do not have added sugar, fat, or salt (sodium). These often cost the same but are healthier for you. Products may be labeled as: ? Sugar-free. ? Nonfat. ? Low-fat. ? Sodium-free. ? Low-sodium.  Look for lean ground beef labeled as at least 92% lean and 8% fat. Shopping  Buy only the items on your grocery list and go only to the areas of the store that have the items on your list.  Use coupons only for  foods and brands you normally buy. Avoid buying items you wouldn't normally buy simply because they are on sale.  Check online and in newspapers for weekly deals.  Buy healthy items from the bulk bins when available, such as herbs, spices, flour, pasta, nuts, and dried fruit.  Buy fruits and vegetables that are in season. Prices are usually lower on in-season produce.  Look at the unit price on the price tag. Use it to compare different brands and sizes to find out which item is the best deal.  Choose healthy items that are often low-cost, such as carrots, potatoes, apples, bananas, and oranges. Dried or canned beans are a low-cost protein source.  Buy in bulk and freeze extra food. Items you can buy in bulk include meats, fish, poultry, frozen fruits, and frozen vegetables.  Avoid buying "ready-to-eat" foods, such as pre-cut fruits and vegetables and pre-made salads.  If possible, shop around to discover where you can find the best prices. Consider other retailers such as dollar stores, larger wholesale stores, local fruit and vegetable stands, and farmers markets.  Do not shop when you are hungry. If you shop while hungry, it may be hard to stick to your list and budget.  Resist impulse buying. Use your grocery   list as your official plan for the week.  Buy a variety of vegetables and fruits by purchasing fresh, frozen, and canned items.  Look at the top and bottom shelves for deals. Foods at eye level (eye level of an adult or child) are usually more expensive.  Be efficient with your time when shopping. The more time you spend at the store, the more money you are likely to spend.  To save money when choosing more expensive foods like meats and dairy: ? Choose cheaper cuts of meat, such as bone-in chicken thighs and drumsticks instead of skinless and boneless chicken. When you are ready to prepare the chicken, you can remove the skin yourself to make it healthier. ? Choose lean meats  like chicken or turkey instead of beef. ? Choose canned seafood, such as tuna, salmon, or sardines. ? Buy eggs as a low-cost source of protein. ? Buy dried beans and peas, such as lentils, split peas, or kidney beans instead of meats. Dried beans and peas are a good alternative source of protein. ? Buy the larger tubs of yogurt instead of individual-sized containers.  Choose water instead of sodas and other sweetened beverages.  Avoid buying chips, cookies, and other "junk food." These items are usually expensive and not healthy.   Cooking  Make extra food and freeze the extras in meal-sized containers or in individual portions for fast meals and snacks.  Pre-cook on days when you have extra time to prepare meals in advance. You can keep these meals in the fridge or freezer and reheat for a quick meal.  When you come home from the grocery store, wash, peel, and cut fruits and vegetables so they are ready to use and eat. This will help reduce food waste. Meal planning  Do not eat out or get fast food. Prepare food at home.  Make a grocery list and make sure to bring it with you to the store. If you have a smart phone, you could use your phone to create your shopping list.  Plan meals and snacks according to a grocery list and budget you create.  Use leftovers in your meal plan for the week.  Look for recipes where you can cook once and make enough food for two meals.  Prepare budget-friendly types of meals like stews, casseroles, and stir-fry dishes.  Try some meatless meals or try "no cook" meals like salads.  Make sure that half your plate is filled with fruits or vegetables. Choose from fresh, frozen, or canned fruits and vegetables. If eating canned, remember to rinse them before eating. This will remove any excess salt added for packaging. Summary  Eating healthy on a budget is possible if you plan your meals according to your budget, purchase according to your budget and  grocery list, and prepare food yourself.  Tips for buying more food on a limited budget include buying generic brands, using coupons only for foods you normally buy, and buying healthy items from the bulk bins when available.  Tips for buying cheaper food to replace expensive food include choosing cheaper, lean cuts of meat, and buying dried beans and peas. This information is not intended to replace advice given to you by your health care provider. Make sure you discuss any questions you have with your health care provider. Document Revised: 11/12/2019 Document Reviewed: 11/12/2019 Elsevier Patient Education  2021 Elsevier Inc.   Bone Health Bones protect organs, store calcium, anchor muscles, and support the whole body. Keeping your bones   strong is important, especially as you get older. You can take actions to help keep your bones strong and healthy. Why is keeping my bones healthy important? Keeping your bones healthy is important because your body constantly replaces bone cells. Cells get old, and new cells take their place. As we age, we lose bone cells because the body may not be able to make enough new cells to replace the old cells. The amount of bone cells and bone tissue you have is referred to as bone mass. The higher your bone mass, the stronger your bones. The aging process leads to an overall loss of bone mass in the body, which can increase the likelihood of:  Joint pain and stiffness.  Broken bones.  A condition in which the bones become weak and brittle (osteoporosis). A large decline in bone mass occurs in older adults. In women, it occurs about the time of menopause.   What actions can I take to keep my bones healthy? Good health habits are important for maintaining healthy bones. This includes eating nutritious foods and exercising regularly. To have healthy bones, you need to get enough of the right minerals and vitamins. Most nutrition experts recommend getting these  nutrients from the foods that you eat. In some cases, taking supplements may also be recommended. Doing certain types of exercise is also important for bone health. What are the nutritional recommendations for healthy bones? Eating a well-balanced diet with plenty of calcium and vitamin D will help to protect your bones. Nutritional recommendations vary from person to person. Ask your health care provider what is healthy for you. Here are some general guidelines. Get enough calcium Calcium is the most important (essential) mineral for bone health. Most people can get enough calcium from their diet, but supplements may be recommended for people who are at risk for osteoporosis. Good sources of calcium include:  Dairy products, such as low-fat or nonfat milk, cheese, and yogurt.  Dark green leafy vegetables, such as bok choy and broccoli.  Calcium-fortified foods, such as orange juice, cereal, bread, soy beverages, and tofu products.  Nuts, such as almonds. Follow these recommended amounts for daily calcium intake:  Children, age 1-3: 700 mg.  Children, age 4-8: 1,000 mg.  Children, age 9-13: 1,300 mg.  Teens, age 14-18: 1,300 mg.  Adults, age 19-50: 1,000 mg.  Adults, age 51-70: ? Men: 1,000 mg. ? Women: 1,200 mg.  Adults, age 71 or older: 1,200 mg.  Pregnant and breastfeeding females: ? Teens: 1,300 mg. ? Adults: 1,000 mg. Get enough vitamin D Vitamin D is the most essential vitamin for bone health. It helps the body absorb calcium. Sunlight stimulates the skin to make vitamin D, so be sure to get enough sunlight. If you live in a cold climate or you do not get outside often, your health care provider may recommend that you take vitamin D supplements. Good sources of vitamin D in your diet include:  Egg yolks.  Saltwater fish.  Milk and cereal fortified with vitamin D. Follow these recommended amounts for daily vitamin D intake:  Children and teens, age 1-18: 600  international units.  Adults, age 50 or younger: 400-800 international units.  Adults, age 51 or older: 800-1,000 international units. Get other important nutrients Other nutrients that are important for bone health include:  Phosphorus. This mineral is found in meat, poultry, dairy foods, nuts, and legumes. The recommended daily intake for adult men and adult women is 700 mg.  Magnesium. This mineral   is found in seeds, nuts, dark green vegetables, and legumes. The recommended daily intake for adult men is 400-420 mg. For adult women, it is 310-320 mg.  Vitamin K. This vitamin is found in green leafy vegetables. The recommended daily intake is 120 mg for adult men and 90 mg for adult women.   What type of physical activity is best for building and maintaining healthy bones? Weight-bearing and strength-building activities are important for building and maintaining healthy bones. Weight-bearing activities cause muscles and bones to work against gravity. Strength-building activities increase the strength of the muscles that support bones. Weight-bearing and muscle-building activities include:  Walking and hiking.  Jogging and running.  Dancing.  Gym exercises.  Lifting weights.  Tennis and racquetball.  Climbing stairs.  Aerobics. Adults should get at least 30 minutes of moderate physical activity on most days. Children should get at least 60 minutes of moderate physical activity on most days. Ask your health care provider what type of exercise is best for you.   How can I find out if my bone mass is low? Bone mass can be measured with an X-ray test called a bone mineral density (BMD) test. This test is recommended for all women who are age 65 or older. It may also be recommended for:  Men who are age 70 or older.  People who are at risk for osteoporosis because of: ? Having bones that break easily. ? Having a long-term disease that weakens bones, such as kidney disease or  rheumatoid arthritis. ? Having menopause earlier than normal. ? Taking medicine that weakens bones, such as steroids, thyroid hormones, or hormone treatment for breast cancer or prostate cancer. ? Smoking. ? Drinking three or more alcoholic drinks a day. If you find that you have a low bone mass, you may be able to prevent osteoporosis or further bone loss by changing your diet and lifestyle. Where can I find more information? For more information, check out the following websites:  National Osteoporosis Foundation: www.nof.org/patients  National Institutes of Health: www.bones.nih.gov  International Osteoporosis Foundation: www.iofbonehealth.org Summary  The aging process leads to an overall loss of bone mass in the body, which can increase the likelihood of broken bones and osteoporosis.  Eating a well-balanced diet with plenty of calcium and vitamin D will help to protect your bones.  Weight-bearing and strength-building activities are also important for building and maintaining strong bones.  Bone mass can be measured with an X-ray test called a bone mineral density (BMD) test. This information is not intended to replace advice given to you by your health care provider. Make sure you discuss any questions you have with your health care provider. Document Revised: 02/25/2017 Document Reviewed: 02/25/2017 Elsevier Patient Education  2021 Elsevier Inc.   

## 2020-03-22 NOTE — Progress Notes (Signed)
Gynecology Annual Exam  PCP: Derinda Late, MD  Chief Complaint:  Chief Complaint  Patient presents with  . Gynecologic Exam    History of Present Illness: Patient is a 55 y.o. [id number] presents for annual exam. The patient has no complaints today.   LMP: No LMP recorded. Patient has had a hysterectomy. She denies postmenopausal bleeding or spotting  Reports in the last 6 months she has been having problematic hot flashes occurring 4-5 times a day every day. She is dressing in layers and has many fans.  She is also having some difficulty sleeping and has been taking tylenol PM almost daily.   The patient is sexually active. She denies dyspareunia.   The patient does perform self breast exams.  There is notable family history of breast or ovarian cancer in her family.  The patient has regular exercise: walking 3 times a week  The patient denies current symptoms of depression.   PHQ-9: 2 GAD-7: 4   Review of Systems: Review of Systems  Constitutional: Negative for chills, fever, malaise/fatigue and weight loss.  HENT: Negative for congestion, hearing loss and sinus pain.   Eyes: Negative for blurred vision and double vision.  Respiratory: Negative for cough, sputum production, shortness of breath and wheezing.   Cardiovascular: Negative for chest pain, palpitations, orthopnea and leg swelling.  Gastrointestinal: Negative for abdominal pain, constipation, diarrhea, nausea and vomiting.  Genitourinary: Negative for dysuria, flank pain, frequency, hematuria and urgency.  Musculoskeletal: Negative for back pain, falls and joint pain.  Skin: Negative for itching and rash.  Neurological: Negative for dizziness and headaches.  Psychiatric/Behavioral: Negative for depression, substance abuse and suicidal ideas. The patient is not nervous/anxious.     Past Medical History:  Past Medical History:  Diagnosis Date  . Goiter   . Uterine fibroid     Past Surgical History:   Past Surgical History:  Procedure Laterality Date  . ABDOMINAL HYSTERECTOMY  2014   Partial   . TOTAL THYROIDECTOMY Right 2014    Gynecologic History:  No LMP recorded. Patient has had a hysterectomy. Last Pap: discontinued because of hysterectomy Last mammogram: 2019  Results were: BI-RAD I  Obstetric History: [id number]  Family History:  Family History  Problem Relation Age of Onset  . Dementia Mother 33  . Heart failure Mother   . Parkinson's disease Father   . Breast cancer Neg Hx     Social History:  Social History   Socioeconomic History  . Marital status: Married    Spouse name: Not on file  . Number of children: Not on file  . Years of education: Not on file  . Highest education level: Not on file  Occupational History  . Not on file  Tobacco Use  . Smoking status: Never Smoker  . Smokeless tobacco: Never Used  Vaping Use  . Vaping Use: Never used  Substance and Sexual Activity  . Alcohol use: Yes    Alcohol/week: 3.0 standard drinks    Types: 3 Standard drinks or equivalent per week  . Drug use: Never  . Sexual activity: Yes    Birth control/protection: Surgical    Comment: Hysterectomy   Other Topics Concern  . Not on file  Social History Narrative  . Not on file   Social Determinants of Health   Financial Resource Strain: Not on file  Food Insecurity: Not on file  Transportation Needs: Not on file  Physical Activity: Not on file  Stress: Not on file  Social Connections: Not on file  Intimate Partner Violence: Not on file    Allergies:  Allergies  Allergen Reactions  . Penicillins Hives    Medications: Prior to Admission medications   Not on File    Physical Exam Vitals: Blood pressure 120/72, height 5\' 6"  (1.676 m), weight 153 lb (69.4 kg).  Physical Exam Constitutional:      Appearance: She is well-developed.  Genitourinary:     Vagina and uterus normal.     There is no lesion on the right labia.     There is no lesion on  the left labia.    Genitourinary Comments: External: Normal appearing vulva. No lesions noted.  Bimanual examination: Uterus absent.  No adnexal masses. No adnexal tenderness. Pelvis not fixed.      Uterus is absent.  Breasts:     Right: No inverted nipple, mass, nipple discharge or skin change.     Left: No inverted nipple, mass, nipple discharge or skin change.    HENT:     Head: Normocephalic and atraumatic.  Eyes:     Extraocular Movements: EOM normal.  Neck:     Thyroid: No thyromegaly.  Cardiovascular:     Rate and Rhythm: Normal rate and regular rhythm.     Heart sounds: Normal heart sounds.  Pulmonary:     Effort: Pulmonary effort is normal.     Breath sounds: Normal breath sounds.  Abdominal:     General: Bowel sounds are normal. There is no distension.     Palpations: Abdomen is soft. There is no mass.  Musculoskeletal:     Cervical back: Neck supple.  Neurological:     Mental Status: She is alert and oriented to person, place, and time.  Skin:    General: Skin is warm and dry.  Psychiatric:        Mood and Affect: Mood and affect normal.        Behavior: Behavior normal.        Thought Content: Thought content normal.        Judgment: Judgment normal.  Vitals reviewed.      Female chaperone present for pelvic and breast  portions of the physical exam  Assessment: 55 y.o. [id number] routine annual exam  Plan: Problem List Items Addressed This Visit   None   Visit Diagnoses    Encounter for annual routine gynecological examination    -  Primary   Health maintenance examination       Breast cancer screening by mammogram       Relevant Orders   MM 3D SCREEN BREAST BILATERAL   Cervical cancer screening       Encounter for gynecological examination without abnormal finding       Screening cholesterol level       Relevant Orders   Lipid panel   Screening for diabetes mellitus       Relevant Orders   Basic Metabolic Panel (BMET)   Encounter for screening  breast examination       Screening for thyroid disorder       Relevant Orders   TSH   Screening for deficiency anemia       Relevant Orders   CBC With Differential      1) Mammogram - recommend yearly screening mammogram.  Mammogram Was ordered today  2) STI screening was offered and declined  3) ASCCP guidelines and rational discussed.  Patient opts for discontinuation secondary to prior hysterectomy.   4) Colonoscopy --  Cologuard 4/8/20201 normal- repeat in 3 years  5) Routine healthcare maintenance including cholesterol, diabetes screening discussed to return fasting at a later date. Reviewed ASCVD risk calculator score: 2.4% 10 year risk  6) Osteoporosis screening -   FRAX SCORE 1. Age: 53 2. Sex: female 3. Weight: 69 kg 4. Height: 167 cm 5. Previous Fracture: No 6. Parent Hip Fracture: No 7. Current Smoking: No 8. Glucocorticoids: No 9. Rheumatoid arthritis: No 10. Secondary osteoporosis: No 11. Alcohol ( 3 or more units a day):  No 12. Femoral Neck BMD (g/cm2): unknown  RESULT 10 year risk of Major Osteoporotic Fracture: 6.1% 10 year risk of Hip Fracture: 0.4%  7) Reviewed options for managing hot flashes- will trial paxil. Will send mychart message in 4 weeks to follow up if she is happy with medication.  Wishes to avoid HRT.  Adrian Prows MD, [facility], [facility] 03/22/2020 4:16 PM

## 2020-04-13 ENCOUNTER — Other Ambulatory Visit: Payer: Self-pay | Admitting: Obstetrics and Gynecology

## 2020-04-13 DIAGNOSIS — N951 Menopausal and female climacteric states: Secondary | ICD-10-CM

## 2020-07-14 NOTE — Telephone Encounter (Signed)
Called the patient- she did not answer, voicemail full.

## 2020-07-15 ENCOUNTER — Other Ambulatory Visit: Payer: Self-pay | Admitting: Obstetrics and Gynecology

## 2020-07-15 DIAGNOSIS — R197 Diarrhea, unspecified: Secondary | ICD-10-CM

## 2020-07-27 ENCOUNTER — Other Ambulatory Visit: Payer: Self-pay

## 2020-07-27 ENCOUNTER — Ambulatory Visit
Admission: RE | Admit: 2020-07-27 | Discharge: 2020-07-27 | Disposition: A | Discharge status: Home / Self Care | Payer: BC Managed Care – PPO | Source: Ambulatory Visit | Attending: Obstetrics and Gynecology | Admitting: Obstetrics and Gynecology

## 2020-07-27 DIAGNOSIS — Z1231 Encounter for screening mammogram for malignant neoplasm of breast: Secondary | ICD-10-CM

## 2020-08-03 ENCOUNTER — Other Ambulatory Visit: Payer: Self-pay

## 2020-08-04 ENCOUNTER — Other Ambulatory Visit: Payer: Self-pay

## 2020-08-04 ENCOUNTER — Encounter: Payer: Self-pay | Admitting: Gastroenterology

## 2020-08-04 ENCOUNTER — Ambulatory Visit: Payer: BC Managed Care – PPO | Admitting: Gastroenterology

## 2020-08-04 VITALS — BP 133/86 | HR 81 | Temp 97.9°F | Ht 66.0 in | Wt 153.1 lb

## 2020-08-04 DIAGNOSIS — K529 Noninfective gastroenteritis and colitis, unspecified: Secondary | ICD-10-CM | POA: Diagnosis not present

## 2020-08-04 MED ORDER — CLENPIQ 10-3.5-12 MG-GM -GM/160ML PO SOLN: 320.0000 mL | Freq: Once | ORAL | 0 refills | Status: AC

## 2020-08-04 NOTE — Progress Notes (Signed)
Cephas Darby, MD [address]  [place]  [place], [place] [address]  Main: [phone]  Fax: [phone]    Gastroenterology Consultation  Referring Provider:     Homero Fellers, * Primary Care Physician:  Derinda Late, MD Primary Gastroenterologist:  Dr. Cephas Darby Reason for Consultation:     Chronic diarrhea        HPI:   Katelyn Guzman is a 55 y.o. female referred by Dr. Derinda Late, MD  for consultation & management of 3 months history of 4-5 nonbloody loose movements daily, predominantly postprandial.  She underwent infectious work-up that was negative including C. difficile.  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  She had mild hypokalemia for which she is taking potassium supplements.  She reports that celiac disease runs in her family.  No known history of IBD She does not smoke or drink alcohol.  No evidence of anemia, thyroid panel normal, hemoglobin A1c 5.7. Patient is taking Bentyl occasionally  NSAIDs: None  Antiplts/Anticoagulants/Anti thrombotics: None  GI Procedures: None  Past Medical History:  Diagnosis Date   Goiter    Uterine fibroid     Past Surgical History:  Procedure Laterality Date   ABDOMINAL HYSTERECTOMY  2014   Partial    TOTAL THYROIDECTOMY Right 2014    Current Outpatient Medications:    dicyclomine (BENTYL) 20 MG tablet, Take by mouth 4 (four) times daily as needed., Disp: , Rfl:    Sod Picosulfate-Mag Ox-Cit Acd (CLENPIQ) 10-3.5-12 MG-GM -GM/160ML SOLN, Take 320 mLs by mouth once for 1 dose., Disp: 320 mL, Rfl: 0  Family History  Problem Relation Age of Onset   Dementia Mother 53   Heart failure Mother    Parkinson's disease Father    Breast cancer Neg Hx      Social History   Tobacco Use   Smoking status: Never   Smokeless tobacco: Never  Vaping Use   Vaping Use: Never used   Substance Use Topics   Alcohol use: Yes    Alcohol/week: 3.0 standard drinks    Types: 3 Standard drinks or equivalent per week   Drug use: Never    Allergies as of 08/04/2020 - Review Complete 08/04/2020  Allergen Reaction Noted   Penicillins Hives 07/28/2013    Review of Systems:    All systems reviewed and negative except where noted in HPI.   Physical Exam:  BP 133/86 (BP Location: Left Arm, Patient Position: Sitting, Cuff Size: Normal)   Pulse 81   Temp 97.9 F (36.6 C) (Oral)   Ht 5\' 6"  (1.676 m)   Wt 153 lb 2 oz (69.5 kg)   BMI 24.72 kg/m  No LMP recorded. Patient has had a hysterectomy.  General:   Alert,  Well-developed, well-nourished, pleasant and cooperative in NAD Head:  Normocephalic and atraumatic. Eyes:  Sclera clear, no icterus.   Conjunctiva pink. Ears:  Normal auditory acuity. Nose:  No deformity, discharge, or lesions. Mouth:  No deformity or lesions,oropharynx pink & moist. Neck:  Supple; no masses or thyromegaly. Lungs:  Respirations even and unlabored.  Clear throughout to auscultation.   No wheezes, crackles, or rhonchi. No acute distress. Heart:  Regular rate and rhythm; no murmurs, clicks, rubs, or gallops. Abdomen:  Normal bowel sounds. Soft, non-tender and non-distended without masses, hepatosplenomegaly or hernias noted.  No guarding or rebound  tenderness.   Rectal: Not performed Msk:  Symmetrical without gross deformities. Good, equal movement & strength bilaterally. Pulses:  Normal pulses noted. Extremities:  No clubbing or edema.  No cyanosis. Neurologic:  Alert and oriented x3;  grossly normal neurologically. Skin:  Intact without significant lesions or rashes. No jaundice. Psych:  Alert and cooperative. Normal mood and affect.  Imaging Studies: No abdominal imaging  Assessment and Plan:   Katelyn Guzman is a 55 y.o. pleasant Caucasian female with no significant past medical history is seen in consultation for 3 months history of  nonbloody diarrhea.  TSH normal, no evidence of anemia, stool studies were negative for infectious etiology  Recommend celiac disease panel, H. pylori breath test Recheck serum potassium levels Recommend colonoscopy with TI evaluation and random colon biopsies   Follow up in 3 months   Cephas Darby, MD

## 2020-08-05 ENCOUNTER — Encounter: Payer: Self-pay | Admitting: Gastroenterology

## 2020-08-05 LAB — H. PYLORI BREATH TEST: H pylori Breath Test: NEGATIVE

## 2020-08-09 LAB — CELIAC DISEASE PANEL
Endomysial IgA: NEGATIVE
IgA/Immunoglobulin A, Serum: 149 mg/dL (ref 87–352)
Transglutaminase IgA: <2 U/mL (ref 0–3)

## 2020-08-09 LAB — POTASSIUM: Potassium: 3.7 mmol/L (ref 3.5–5.2)

## 2020-08-17 ENCOUNTER — Encounter: Payer: Self-pay | Admitting: Gastroenterology

## 2020-08-18 ENCOUNTER — Telehealth: Payer: Self-pay | Admitting: Gastroenterology

## 2020-08-18 NOTE — Telephone Encounter (Signed)
Patient wants a call back to discuss diagnostic or screening charges pertaining to her upcoming procedure.The insurance will cover screenings , per patient. Clinical staff will follow up with patient.

## 2020-08-18 NOTE — Telephone Encounter (Signed)
Patient state she has reached out to PCP office and asked them to send a new referral for screening colonoscopy.

## 2020-08-18 NOTE — Telephone Encounter (Signed)
Called ENDO and cancel procedure with Dava.

## 2020-08-23 ENCOUNTER — Ambulatory Visit
Admission: RE | Admit: 2020-08-23 | Payer: BC Managed Care – PPO | Source: Home / Self Care | Admitting: Gastroenterology

## 2020-08-23 ENCOUNTER — Other Ambulatory Visit: Payer: Self-pay | Admitting: Obstetrics and Gynecology

## 2020-08-23 ENCOUNTER — Encounter: Admission: RE | Payer: Self-pay | Source: Home / Self Care

## 2020-08-23 DIAGNOSIS — Z1211 Encounter for screening for malignant neoplasm of colon: Secondary | ICD-10-CM

## 2020-08-23 SURGERY — COLONOSCOPY WITH PROPOFOL
Anesthesia: General

## 2020-09-15 ENCOUNTER — Encounter: Payer: Self-pay | Admitting: Gastroenterology

## 2020-09-19 NOTE — Anesthesia Preprocedure Evaluation (Addendum)
Anesthesia Evaluation  Patient identified by MRN, date of birth, ID band Patient awake    Reviewed: Allergy & Precautions, NPO status , Patient's Chart, lab work & pertinent test results  History of Anesthesia Complications Negative for: history of anesthetic complications  Airway Mallampati: III   Neck ROM: Full    Dental   Upper bridge:   Pulmonary neg pulmonary ROS,    Pulmonary exam normal breath sounds clear to auscultation       Cardiovascular Exercise Tolerance: Good negative cardio ROS Normal cardiovascular exam Rhythm:Regular Rate:Normal     Neuro/Psych negative neurological ROS     GI/Hepatic negative GI ROS,   Endo/Other  negative endocrine ROS  Renal/GU negative Renal ROS     Musculoskeletal   Abdominal   Peds  Hematology negative hematology ROS (+)   Anesthesia Other Findings   Reproductive/Obstetrics Hx uterine fibroids s/p hysterectomy                            Anesthesia Physical Anesthesia Plan  ASA: 1  Anesthesia Plan: General   Post-op Pain Management:    Induction: Intravenous  PONV Risk Score and Plan: 3 and Propofol infusion, TIVA and Treatment may vary due to age or medical condition  Airway Management Planned: Natural Airway  Additional Equipment:   Intra-op Plan:   Post-operative Plan:   Informed Consent: I have reviewed the patients History and Physical, chart, labs and discussed the procedure including the risks, benefits and alternatives for the proposed anesthesia with the patient or authorized representative who has indicated his/her understanding and acceptance.       Plan Discussed with: CRNA  Anesthesia Plan Comments:        Anesthesia Quick Evaluation

## 2020-09-20 ENCOUNTER — Encounter: Payer: Self-pay | Admitting: Gastroenterology

## 2020-09-20 ENCOUNTER — Other Ambulatory Visit: Payer: Self-pay

## 2020-09-20 ENCOUNTER — Ambulatory Visit
Admission: RE | Admit: 2020-09-20 | Discharge: 2020-09-20 | Disposition: A | Discharge status: Home / Self Care | Payer: BC Managed Care – PPO | Attending: Gastroenterology | Admitting: Gastroenterology

## 2020-09-20 ENCOUNTER — Ambulatory Visit: Payer: BC Managed Care – PPO | Admitting: Anesthesiology

## 2020-09-20 ENCOUNTER — Encounter
Admission: RE | Disposition: A | Discharge status: Home / Self Care | Payer: Self-pay | Source: Home / Self Care | Attending: Gastroenterology

## 2020-09-20 DIAGNOSIS — D175 Benign lipomatous neoplasm of intra-abdominal organs: Secondary | ICD-10-CM | POA: Diagnosis not present

## 2020-09-20 DIAGNOSIS — Z820 Family history of epilepsy and other diseases of the nervous system: Secondary | ICD-10-CM | POA: Diagnosis not present

## 2020-09-20 DIAGNOSIS — Z8249 Family history of ischemic heart disease and other diseases of the circulatory system: Secondary | ICD-10-CM | POA: Diagnosis not present

## 2020-09-20 DIAGNOSIS — D125 Benign neoplasm of sigmoid colon: Secondary | ICD-10-CM | POA: Insufficient documentation

## 2020-09-20 DIAGNOSIS — Z79899 Other long term (current) drug therapy: Secondary | ICD-10-CM | POA: Diagnosis not present

## 2020-09-20 DIAGNOSIS — Z1211 Encounter for screening for malignant neoplasm of colon: Secondary | ICD-10-CM

## 2020-09-20 DIAGNOSIS — K635 Polyp of colon: Secondary | ICD-10-CM | POA: Diagnosis not present

## 2020-09-20 HISTORY — PX: POLYPECTOMY: SHX5525

## 2020-09-20 HISTORY — PX: COLONOSCOPY: SHX5424

## 2020-09-20 SURGERY — COLONOSCOPY
Anesthesia: General | Site: Rectum

## 2020-09-20 MED ORDER — PROPOFOL 10 MG/ML IV BOLUS
INTRAVENOUS | Status: DC | PRN
  Administered 2020-09-20 (×2): 20 mg via INTRAVENOUS
  Administered 2020-09-20: 40 mg via INTRAVENOUS
  Administered 2020-09-20 (×2): 20 mg via INTRAVENOUS
  Administered 2020-09-20: 30 mg via INTRAVENOUS
  Administered 2020-09-20 (×3): 20 mg via INTRAVENOUS
  Administered 2020-09-20: 80 mg via INTRAVENOUS
  Administered 2020-09-20 (×3): 20 mg via INTRAVENOUS

## 2020-09-20 MED ORDER — LIDOCAINE HCL (CARDIAC) PF 100 MG/5ML IV SOSY
PREFILLED_SYRINGE | INTRAVENOUS | Status: DC | PRN
  Administered 2020-09-20: 50 mg via INTRAVENOUS

## 2020-09-20 MED ORDER — STERILE WATER FOR IRRIGATION IR SOLN
Status: DC | PRN
  Administered 2020-09-20: .05 mL

## 2020-09-20 MED ORDER — LACTATED RINGERS IV SOLN: INTRAVENOUS | Status: DC

## 2020-09-20 MED ORDER — SODIUM CHLORIDE 0.9 % IV SOLN: INTRAVENOUS | Status: DC

## 2020-09-20 SURGICAL SUPPLY — 7 items
FORCEPS BIOP RAD 4 LRG CAP 4 (CUTTING FORCEPS) ×3 IMPLANT
GOWN CVR UNV OPN BCK APRN NK (MISCELLANEOUS) ×4 IMPLANT
GOWN ISOL THUMB LOOP REG UNIV (MISCELLANEOUS) ×6
KIT PRC NS LF DISP ENDO (KITS) ×2 IMPLANT
KIT PROCEDURE OLYMPUS (KITS) ×3
MANIFOLD NEPTUNE II (INSTRUMENTS) ×3 IMPLANT
WATER STERILE IRR 250ML POUR (IV SOLUTION) ×3 IMPLANT

## 2020-09-20 NOTE — Anesthesia Procedure Notes (Signed)
Date/Time: 09/20/2020 11:29 AM Performed by: Mayme Genta, CRNA Pre-anesthesia Checklist: Patient identified, Emergency Drugs available, Suction available, Timeout performed and Patient being monitored Patient Re-evaluated:Patient Re-evaluated prior to induction Oxygen Delivery Method: Nasal cannula Placement Confirmation: positive ETCO2

## 2020-09-20 NOTE — H&P (Signed)
Vonda Antigua, MD [address], [place], [place], [place], [address] [address], [place], [place], [place], [address] Phone: [phone]  Fax: [phone]  Primary Care Physician:  Derinda Late, MD   Pre-Procedure History & Physical: HPI:  Katelyn Guzman is a 55 y.o. female is here for a colonoscopy.   Past Medical History:  Diagnosis Date   Uterine fibroid     Past Surgical History:  Procedure Laterality Date   ABDOMINAL HYSTERECTOMY  2014   Partial    TOTAL THYROIDECTOMY Right 2014    Prior to Admission medications   Medication Sig Start Date End Date Taking? Authorizing Provider  dicyclomine (BENTYL) 20 MG tablet Take by mouth 4 (four) times daily as needed. 07/17/20  Yes [provider]    Allergies as of 08/30/2020 - Review Complete 08/04/2020  Allergen Reaction Noted   Penicillins Hives 07/28/2013    Family History  Problem Relation Age of Onset   Dementia Mother 17   Heart failure Mother    Parkinson's disease Father    Breast cancer Neg Hx     Social History   Socioeconomic History   Marital status: Married    Spouse name: Not on file   Number of children: Not on file   Years of education: Not on file   Highest education level: Not on file  Occupational History   Not on file  Tobacco Use   Smoking status: Never   Smokeless tobacco: Never  Vaping Use   Vaping Use: Never used  Substance and Sexual Activity   Alcohol use: Yes    Alcohol/week: 3.0 standard drinks    Types: 3 Standard drinks or equivalent per week   Drug use: Never   Sexual activity: Yes    Birth control/protection: Surgical    Comment: Hysterectomy   Other Topics Concern   Not on file  Social History Narrative   Not on file   Social Determinants of Health   Financial Resource Strain: Not on file  Food Insecurity: Not on file  Transportation Needs: Not on file  Physical Activity: Not on file  Stress: Not on file  Social Connections: Not on file   Intimate Partner Violence: Not on file    Review of Systems: See HPI, otherwise negative ROS  Physical Exam: Constitutional: General:   Alert,  Well-developed, well-nourished, pleasant and cooperative in NAD BP (!) 147/79   Pulse 74   Temp (!) 97.5 F (36.4 C) (Temporal)   Wt 68 kg   SpO2 98%   BMI 24.21 kg/m   Head: Normocephalic, atraumatic.   Eyes:  Sclera clear, no icterus.   Conjunctiva pink.   Mouth:  No deformity or lesions, oropharynx pink & moist.  Neck:  Supple, trachea midline  Respiratory: Normal respiratory effort  Gastrointestinal:  Soft, non-tender and non-distended without masses, hepatosplenomegaly or hernias noted.  No guarding or rebound tenderness.     Cardiac: No clubbing or edema.  No cyanosis. Normal posterior tibial pedal pulses noted.  Lymphatic:  No significant cervical adenopathy.  Psych:  Alert and cooperative. Normal mood and affect.  Musculoskeletal:   Symmetrical without gross deformities. 5/5 Lower extremity strength bilaterally.  Skin: Warm. Intact without significant lesions or rashes. No jaundice.  Neurologic:  Face symmetrical, tongue midline, Normal sensation to touch;  grossly normal neurologically.  Psych:  Alert and oriented x3, Alert and cooperative. Normal mood and affect.  Impression/Plan: Katelyn Guzman is here for a colonoscopy to be performed for  average risk screening. Pt saw Dr. Marius Ditch in clinic and evaluation of the terminal ileum and random colon biopsies for microscopic colitis were also recommended for diarrhea  Risks, benefits, limitations, and alternatives regarding  colonoscopy have been reviewed with the patient.  Questions have been answered.  All parties agreeable.   Virgel Manifold, MD  09/20/2020, 10:40 AM

## 2020-09-20 NOTE — Op Note (Signed)
[facility] Gastroenterology Patient Name: Katelyn Guzman Procedure Date: 09/20/2020 11:23 AM MRN: [id number] Account #: [id number] Date of Birth: 29-Jun-1965 Admit Type: Outpatient Age: 55 Room: [facility] OR ROOM 01 Gender: Female Note Status: Finalized Procedure:             Colonoscopy Indications:           Screening for colorectal malignant neoplasm Providers:             Ashden Sonnenberg B. Bonna Gains MD, MD Referring MD:          Caprice Renshaw MD (Referring MD) Medicines:             Monitored Anesthesia Care Complications:         No immediate complications. Procedure:             Pre-Anesthesia Assessment:                        - ASA Grade Assessment: II - A patient with mild                         systemic disease.                        - Prior to the procedure, a History and Physical was                         performed, and patient medications, allergies and                         sensitivities were reviewed. The patient's tolerance                         of previous anesthesia was reviewed.                        - The risks and benefits of the procedure and the                         sedation options and risks were discussed with the                         patient. All questions were answered and informed                         consent was obtained.                        - Patient identification and proposed procedure were                         verified prior to the procedure by the physician, the                         nurse, the anesthesiologist, the anesthetist and the                         technician. The procedure was verified in the                         procedure  room.                        After obtaining informed consent, the colonoscope was                         passed under direct vision. Throughout the procedure,                         the patient's blood pressure, pulse, and oxygen                         saturations were  monitored continuously. The                         Colonoscope was introduced through the anus and                         advanced to the the terminal ileum. The colonoscopy                         was performed with ease. The patient tolerated the                         procedure well. The quality of the bowel preparation                         was good. Findings:      The perianal and digital rectal examinations were normal.      There was a lipoma, in the ascending colon. Biopsies were taken with a       cold forceps for histology. Tunnel biopsies showed bright yellow       material consistent with lipoma.      Two flat and sessile polyps were found in the sigmoid colon. The polyps       were 4 to 5 mm in size. These polyps were removed with a cold biopsy       forceps. Resection and retrieval were complete.      The exam was otherwise without abnormality.      The rectum, sigmoid colon, descending colon, transverse colon, ascending       colon, cecum and ileum appeared normal. Biopsies for histology were       taken with a cold forceps from the entire colon for evaluation of       microscopic colitis.      The retroflexed view of the distal rectum and anal verge was normal and       showed no anal or rectal abnormalities. Impression:            - Lipoma in the ascending colon. Biopsied.                        - Two 4 to 5 mm polyps in the sigmoid colon, removed                         with a cold biopsy forceps. Resected and retrieved.                        - The examination was otherwise normal.                        -  The rectum, sigmoid colon, descending colon,                         transverse colon, ascending colon, cecum and terminal                         ileum are normal. Biopsied.                        - The distal rectum and anal verge are normal on                         retroflexion view. Recommendation:        - Discharge patient to home (with escort).                         - Advance diet as tolerated.                        - Continue present medications.                        - Await pathology results.                        - Repeat colonoscopy date to be determined after                         pending pathology results are reviewed.                        - The findings and recommendations were discussed with                         the patient.                        - The findings and recommendations were discussed with                         the patient's family.                        - Return to primary care physician as previously                         scheduled. Procedure Code(s):     --- Professional ---                        [phone], Colonoscopy, flexible; with biopsy, single or                         multiple Diagnosis Code(s):     --- Professional ---                        Z12.11, Encounter for screening for malignant neoplasm                         of colon  K63.5, Polyp of colon CPT copyright 2019 American Medical Association. All rights reserved. The codes documented in this report are preliminary and upon coder review may  be revised to meet current compliance requirements.  Vonda Antigua, MD Margretta Sidle B. Bonna Gains MD, MD 09/20/2020 12:00:28 PM This report has been signed electronically. Number of Addenda: 0 Note Initiated On: 09/20/2020 11:23 AM Scope Withdrawal Time: 0 hours 18 minutes 56 seconds  Total Procedure Duration: 0 hours 23 minutes 20 seconds  Estimated Blood Loss:  Estimated blood loss: none.      [facility]

## 2020-09-20 NOTE — Transfer of Care (Signed)
Immediate Anesthesia Transfer of Care Note  Patient: Katelyn Guzman  Procedure(s) Performed: COLONOSCOPY (Rectum) POLYPECTOMY (Rectum)  Patient Location: PACU  Anesthesia Type: General  Level of Consciousness: awake, alert  and patient cooperative  Airway and Oxygen Therapy: Patient Spontanous Breathing and Patient connected to supplemental oxygen  Post-op Assessment: Post-op Vital signs reviewed, Patient's Cardiovascular Status Stable, Respiratory Function Stable, Patent Airway and No signs of Nausea or vomiting  Post-op Vital Signs: Reviewed and stable  Complications: No notable events documented.

## 2020-09-21 ENCOUNTER — Encounter: Payer: Self-pay | Admitting: Gastroenterology

## 2020-09-21 NOTE — Anesthesia Postprocedure Evaluation (Signed)
Anesthesia Post Note  Patient: Katelyn Guzman  Procedure(s) Performed: COLONOSCOPY (Rectum) POLYPECTOMY (Rectum)     Patient location during evaluation: PACU Anesthesia Type: General Level of consciousness: awake and alert, oriented and patient cooperative Pain management: pain level controlled Vital Signs Assessment: post-procedure vital signs reviewed and stable Respiratory status: spontaneous breathing, nonlabored ventilation and respiratory function stable Cardiovascular status: blood pressure returned to baseline and stable Postop Assessment: adequate PO intake Anesthetic complications: no   No notable events documented.  Darrin Nipper

## 2020-09-22 LAB — SURGICAL PATHOLOGY

## 2020-09-23 ENCOUNTER — Encounter: Payer: Self-pay | Admitting: Gastroenterology

## 2020-11-01 ENCOUNTER — Other Ambulatory Visit: Payer: Self-pay

## 2020-11-01 ENCOUNTER — Ambulatory Visit: Payer: BC Managed Care – PPO | Admitting: Gastroenterology

## 2020-11-01 ENCOUNTER — Encounter: Payer: Self-pay | Admitting: Gastroenterology

## 2020-11-01 VITALS — BP 132/84 | HR 73 | Temp 97.7°F | Ht 66.0 in | Wt 156.0 lb

## 2020-11-01 DIAGNOSIS — K580 Irritable bowel syndrome with diarrhea: Secondary | ICD-10-CM

## 2020-11-01 NOTE — Progress Notes (Signed)
Cephas Darby, MD [address]  [place]  [place], [place] [address]  Main: [phone]  Fax: [phone]    Gastroenterology Consultation  Referring Provider:     Derinda Late, MD Primary Care Physician:  Derinda Late, MD Primary Gastroenterologist:  Dr. Cephas Darby Reason for Consultation:     Chronic diarrhea        HPI:   Katelyn Guzman is a 55 y.o. female referred by Dr. Derinda Late, MD  for consultation & management of 3 months history of 4-5 nonbloody loose movements daily, predominantly postprandial.  She underwent infectious work-up that was negative including C. difficile.  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  She had mild hypokalemia for which she is taking potassium supplements.  She reports that celiac disease runs in her family.  No known history of IBD She does not smoke or drink alcohol.  No evidence of anemia, thyroid panel normal, hemoglobin A1c 5.7. Patient is taking Bentyl occasionally  Follow-up visit 11/01/2020 Patient is here for follow-up of diarrhea.  Her work-up thus far included H. pylori breath test, celiac disease panel which were negative.  She underwent colonoscopy with TI evaluation and random colon biopsies which were unremarkable.  Two small polyps were removed, pathology confirmed tubular adenomas only.  She reports that her diarrhea has mostly resolved.  She thinks her diarrhea is triggered by stress and after eating certain foods, she thinks she might be lactose intolerant as well.  NSAIDs: None  Antiplts/Anticoagulants/Anti thrombotics: None  GI Procedures:  Colonoscopy 09/20/2020 - Lipoma in the ascending colon. Biopsied. - Two 4 to 5 mm polyps in the sigmoid colon, removed with a cold biopsy forceps. Resected and retrieved. - The examination was otherwise normal. - The rectum, sigmoid colon, descending  colon, transverse colon, ascending colon, cecum and terminal ileum are normal. Biopsied. - The distal rectum and anal verge are normal on retroflexion view.  A.  COLON; RANDOM BIOPSY:  - COLONIC MUCOSA WITH INTACT CRYPT ARCHITECTURE.  - NEGATIVE FOR MICROSCOPIC COLITIS, DYSPLASIA, AND MALIGNANCY.   B.  ASCENDING COLON LIPOMA; BIOPSY:  - UNREMARKABLE COLONIC MUCOSA AND PROMINENT SUBMUCOSAL ADIPOSE TISSUE,  COMPATIBLE WITH LIPOMA.  - NEGATIVE FOR DYSPLASIA AND MALIGNANCY.   C.  COLON POLYP X 2, SIGMOID; BIOPSY:  - TUBULAR ADENOMA, 3 FRAGMENTS, NEGATIVE FOR HIGH-GRADE DYSPLASIA AND  MALIGNANCY.  - HYPERPLASTIC POLYP, NEGATIVE FOR DYSPLASIA AND MALIGNANCY.   Past Medical History:  Diagnosis Date   Uterine fibroid     Past Surgical History:  Procedure Laterality Date   ABDOMINAL HYSTERECTOMY  2014   Partial    COLONOSCOPY N/A 09/20/2020   Procedure: COLONOSCOPY;  Surgeon: Virgel Manifold, MD;  Location: [facility];  Service: Endoscopy;  Laterality: N/A;   POLYPECTOMY  09/20/2020   Procedure: POLYPECTOMY;  Surgeon: Virgel Manifold, MD;  Location: [facility];  Service: Endoscopy;;   TOTAL THYROIDECTOMY Right 2014    Current Outpatient Medications:    naproxen (NAPROSYN) 500 MG tablet, Take 500 mg by mouth 2 (two) times daily as needed., Disp: , Rfl:    dicyclomine (BENTYL) 20 MG tablet, Take by mouth 4 (four) times daily as needed., Disp: , Rfl:   Family History  Problem Relation Age of Onset   Dementia Mother 48   Heart failure Mother    Parkinson's disease Father  Breast cancer Neg Hx      Social History   Tobacco Use   Smoking status: Never   Smokeless tobacco: Never  Vaping Use   Vaping Use: Never used  Substance Use Topics   Alcohol use: Yes    Alcohol/week: 5.0 standard drinks    Types: 5 Standard drinks or equivalent per week   Drug use: Never    Allergies as of 11/01/2020 - Review Complete 11/01/2020  Allergen Reaction Noted    Penicillins Hives 07/28/2013    Review of Systems:    All systems reviewed and negative except where noted in HPI.   Physical Exam:  BP 132/84 (BP Location: Left Arm, Patient Position: Sitting, Cuff Size: Normal)   Pulse 73   Temp 97.7 F (36.5 C) (Oral)   Ht 5\' 6"  (1.676 m)   Wt 156 lb (70.8 kg)   BMI 25.18 kg/m  No LMP recorded. Patient has had a hysterectomy.  General:   Alert,  Well-developed, well-nourished, pleasant and cooperative in NAD Head:  Normocephalic and atraumatic. Eyes:  Sclera clear, no icterus.   Conjunctiva pink. Ears:  Normal auditory acuity. Nose:  No deformity, discharge, or lesions. Mouth:  No deformity or lesions,oropharynx pink & moist. Neck:  Supple; no masses or thyromegaly. Lungs:  Respirations even and unlabored.  Clear throughout to auscultation.   No wheezes, crackles, or rhonchi. No acute distress. Heart:  Regular rate and rhythm; no murmurs, clicks, rubs, or gallops. Abdomen:  Normal bowel sounds. Soft, non-tender and non-distended without masses, hepatosplenomegaly or hernias noted.  No guarding or rebound tenderness.   Rectal: Not performed Msk:  Symmetrical without gross deformities. Good, equal movement & strength bilaterally. Pulses:  Normal pulses noted. Extremities:  No clubbing or edema.  No cyanosis. Neurologic:  Alert and oriented x3;  grossly normal neurologically. Skin:  Intact without significant lesions or rashes. No jaundice. Psych:  Alert and cooperative. Normal mood and affect.  Imaging Studies: No abdominal imaging  Assessment and Plan:   Katelyn Guzman is a 55 y.o. pleasant Caucasian female with no significant past medical history is seen in consultation for 3 months history of nonbloody diarrhea.  TSH normal, no evidence of anemia, stool studies were negative for infectious etiology, celiac disease panel, H. pylori breath test negative and colonoscopy with TI evaluation and random colon biopsies were unremarkable.  Her  diarrhea has mostly resolved.  She probably has IBS, triggered by stress which is currently fairly controlled.  If she develops any flareup, will check pancreatic fecal elastase levels.  We will hold off on further work-up at this time   Follow up as needed   Cephas Darby, MD

## 2021-03-14 ENCOUNTER — Other Ambulatory Visit: Payer: Self-pay | Admitting: Podiatry

## 2021-03-14 DIAGNOSIS — M79671 Pain in right foot: Secondary | ICD-10-CM

## 2021-03-14 DIAGNOSIS — M67471 Ganglion, right ankle and foot: Secondary | ICD-10-CM

## 2021-03-21 ENCOUNTER — Ambulatory Visit: Payer: BC Managed Care – PPO

## 2022-11-01 ENCOUNTER — Other Ambulatory Visit: Payer: Self-pay | Admitting: Student

## 2022-11-01 DIAGNOSIS — Z1231 Encounter for screening mammogram for malignant neoplasm of breast: Secondary | ICD-10-CM

## 2022-11-14 ENCOUNTER — Ambulatory Visit
Admission: RE | Admit: 2022-11-14 | Discharge: 2022-11-14 | Disposition: A | Discharge status: Home / Self Care | Payer: BC Managed Care – PPO | Source: Ambulatory Visit | Attending: Student | Admitting: Student

## 2022-11-14 DIAGNOSIS — Z1231 Encounter for screening mammogram for malignant neoplasm of breast: Secondary | ICD-10-CM | POA: Insufficient documentation

## 2022-11-22 ENCOUNTER — Other Ambulatory Visit: Payer: Self-pay | Admitting: Student

## 2022-11-22 DIAGNOSIS — R928 Other abnormal and inconclusive findings on diagnostic imaging of breast: Secondary | ICD-10-CM

## 2022-12-06 ENCOUNTER — Ambulatory Visit
Admission: RE | Admit: 2022-12-06 | Discharge: 2022-12-06 | Disposition: A | Discharge status: Home / Self Care | Payer: BC Managed Care – PPO | Source: Ambulatory Visit | Attending: Student | Admitting: Student

## 2022-12-06 DIAGNOSIS — R928 Other abnormal and inconclusive findings on diagnostic imaging of breast: Secondary | ICD-10-CM | POA: Diagnosis present

## 2022-12-07 ENCOUNTER — Encounter: Payer: Self-pay | Admitting: Student

## 2022-12-10 ENCOUNTER — Other Ambulatory Visit: Payer: Self-pay | Admitting: Student

## 2022-12-10 DIAGNOSIS — R928 Other abnormal and inconclusive findings on diagnostic imaging of breast: Secondary | ICD-10-CM

## 2022-12-14 ENCOUNTER — Ambulatory Visit
Admission: RE | Admit: 2022-12-14 | Discharge: 2022-12-14 | Disposition: A | Discharge status: Home / Self Care | Payer: BC Managed Care – PPO | Source: Ambulatory Visit | Attending: Student

## 2022-12-14 ENCOUNTER — Ambulatory Visit
Admission: RE | Admit: 2022-12-14 | Discharge: 2022-12-14 | Disposition: A | Discharge status: Home / Self Care | Payer: BC Managed Care – PPO | Source: Ambulatory Visit | Attending: Student | Admitting: Student

## 2022-12-14 DIAGNOSIS — R928 Other abnormal and inconclusive findings on diagnostic imaging of breast: Secondary | ICD-10-CM

## 2022-12-14 DIAGNOSIS — N6011 Diffuse cystic mastopathy of right breast: Secondary | ICD-10-CM | POA: Insufficient documentation

## 2022-12-14 HISTORY — PX: BREAST BIOPSY: SHX20

## 2022-12-14 MED ORDER — LIDOCAINE 1 % OPTIME INJ - NO CHARGE
5.0000 mL | Freq: Once | INTRAMUSCULAR | Status: AC
  Administered 2022-12-14: 5 mL
  Filled 2022-12-14: qty 6

## 2022-12-14 MED ORDER — LIDOCAINE-EPINEPHRINE 1 %-1:100000 IJ SOLN
10.0000 mL | Freq: Once | INTRAMUSCULAR | Status: AC
  Administered 2022-12-14: 10 mL
  Filled 2022-12-14: qty 10

## 2022-12-17 LAB — SURGICAL PATHOLOGY
